# Patient Record
Sex: Male | Born: 1940 | Race: White | Hispanic: No | Marital: Married
Health system: Southern US, Community
[De-identification: ages and names within clinical notes are randomized; demographics above are authoritative.]

## PROBLEM LIST (undated history)

## (undated) ENCOUNTER — Emergency Department (HOSPITAL_COMMUNITY): Disposition: A | Discharge status: Other Acute Inpatient Hospital | Payer: Medicare PPO

## (undated) DIAGNOSIS — I2510 Atherosclerotic heart disease of native coronary artery without angina pectoris: Secondary | ICD-10-CM

## (undated) DIAGNOSIS — G459 Transient cerebral ischemic attack, unspecified: Secondary | ICD-10-CM

## (undated) DIAGNOSIS — E039 Hypothyroidism, unspecified: Secondary | ICD-10-CM

## (undated) DIAGNOSIS — E669 Obesity, unspecified: Secondary | ICD-10-CM

## (undated) DIAGNOSIS — E785 Hyperlipidemia, unspecified: Secondary | ICD-10-CM

## (undated) HISTORY — PX: TRACHEOSTOMY: SUR1362

## (undated) HISTORY — PX: CORONARY ARTERY BYPASS GRAFT: SHX141

## (undated) HISTORY — DX: Hyperlipidemia, unspecified: E78.5

## (undated) HISTORY — DX: Obesity, unspecified: E66.9

## (undated) HISTORY — DX: Hypothyroidism, unspecified: E03.9

## (undated) HISTORY — PX: AMPUTATION: SHX166

## (undated) HISTORY — DX: Transient cerebral ischemic attack, unspecified: G45.9

## (undated) HISTORY — DX: Atherosclerotic heart disease of native coronary artery without angina pectoris: I25.10

---

## 2011-08-23 ENCOUNTER — Encounter: Payer: Self-pay | Admitting: Cardiology

## 2011-08-23 ENCOUNTER — Ambulatory Visit (INDEPENDENT_AMBULATORY_CARE_PROVIDER_SITE_OTHER): Payer: Medicare PPO | Admitting: Cardiology

## 2011-08-23 VITALS — BP 110/60 | Ht 72.0 in | Wt 282.0 lb

## 2011-08-23 DIAGNOSIS — E119 Type 2 diabetes mellitus without complications: Secondary | ICD-10-CM

## 2011-08-23 DIAGNOSIS — I2510 Atherosclerotic heart disease of native coronary artery without angina pectoris: Secondary | ICD-10-CM | POA: Insufficient documentation

## 2011-08-23 DIAGNOSIS — E1140 Type 2 diabetes mellitus with diabetic neuropathy, unspecified: Secondary | ICD-10-CM | POA: Insufficient documentation

## 2011-08-23 DIAGNOSIS — E669 Obesity, unspecified: Secondary | ICD-10-CM

## 2011-08-23 DIAGNOSIS — E785 Hyperlipidemia, unspecified: Secondary | ICD-10-CM

## 2011-08-23 DIAGNOSIS — I10 Essential (primary) hypertension: Secondary | ICD-10-CM

## 2011-08-23 NOTE — Assessment & Plan Note (Signed)
His LDL was 115 when last checked with a goal less than 70.  I will defer to Redmond Baseman, MD

## 2011-08-23 NOTE — Patient Instructions (Signed)
The current medical regimen is effective;  continue present plan and medications.  Follow up in 6 months with Dr Hochrein.  You will receive a letter in the mail 2 months before you are due.  Please call us when you receive this letter to schedule your follow up appointment.  

## 2011-08-23 NOTE — Assessment & Plan Note (Signed)
The blood pressure is at target. No change in medications is indicated. We will continue with therapeutic lifestyle changes (TLC).  

## 2011-08-23 NOTE — Progress Notes (Signed)
HPI The patient presents for followup of known coronary disease. He had bypass at [facility].  He did have a stress perfusion study and I was able to see these results from 2010. He had a mildly reduced ejection fraction with an old inferior infarct. At that time he was having some arm pain. However, since that time he has had no new symptoms.  The patient denies any new symptoms such as chest discomfort, neck or arm discomfort. There has been no new shortness of breath, PND or orthopnea. There have been no reported palpitations, presyncope or syncope.  He is not particularly active however.  He is has relocated to this area. To live close to his daughter.  No Known Allergies  Current Outpatient Prescriptions  Medication Sig Dispense Refill  . aspirin 81 MG tablet Take 81 mg by mouth daily.      . clopidogrel (PLAVIX) 75 MG tablet Take 75 mg by mouth daily.      . fosinopril (MONOPRIL) 20 MG tablet Take 20 mg by mouth daily.      . Insulin Lispro, Human, (HUMALOG PEN ) Inject into the skin. 60 units in am and 40 unit in pm      . levothyroxine (SYNTHROID, LEVOTHROID) 25 MCG tablet Take 25 mcg by mouth daily.      . metFORMIN (GLUCOPHAGE) 500 MG tablet Take 500 mg by mouth 2 (two) times daily with a meal.      . nitroGLYCERIN (NITROSTAT) 0.4 MG SL tablet Place 0.4 mg under the tongue every 5 (five) minutes as needed.      . pravastatin (PRAVACHOL) 40 MG tablet Take 40 mg by mouth daily.        Past Medical History  Diagnosis Date  . CAD (coronary artery disease)   . HTN (hypertension), benign   . Diabetes mellitus   . Hyperlipemia   . Hypothyroidism     Past Surgical History  Procedure Date  . Coronary artery bypass graft     2 vessel 2000, [facility]  . Tracheostomy     Age 12  . Amputation     Right second toe/nonhealing ulcer    Family History  Problem Relation Age of Onset  . Cancer Father     Lung  . Cancer Mother     Breast  . Cancer Brother     Lung     History   Social History  . Marital Status: Widowed    Spouse Name: N/A    Number of Children: N/A  . Years of Education: N/A   Occupational History  . Retired     Pensions consultant for [employer]   Social History Main Topics  . Smoking status: Former Smoker    Quit date: 08/23/1991  . Smokeless tobacco: Not on file  . Alcohol Use: Not on file  . Drug Use: Not on file  . Sexually Active: Not on file   Other Topics Concern  . Not on file   Social History Narrative   Lives with daughter.  He has two children and four grandchildren.      ROS:  Absent peripheral vision, GERD, hip and knee pain. As stated in the HPI and negative for all other systems.  PHYSICAL EXAM BP 110/60  Ht 6' (1.829 m)  Wt 282 lb (127.914 kg)  BMI 38.25 kg/m2 GENERAL:  Well appearing HEENT:  Pupils equal round and reactive, fundi not visualized, oral mucosa unremarkable, dentures. NECK:  No jugular  venous distention, waveform within normal limits, carotid upstroke brisk and symmetric, no bruits, no thyromegaly LYMPHATICS:  No cervical, inguinal adenopathy LUNGS:  Clear to auscultation bilaterally BACK:  No CVA tenderness CHEST:  Well healed sternotomy scar. HEART:  PMI not displaced or sustained,S1 and S2 within normal limits, no S3, no S4, no clicks, no rubs, no murmurs ABD:  Flat, positive bowel sounds normal in frequency in pitch, no bruits, no rebound, no guarding, no midline pulsatile mass, no hepatomegaly, no splenomegaly, obese EXT:  2 plus pulses throughout, bilateral edema, no cyanosis no clubbing SKIN:  No rashes no nodules NEURO:  Cranial nerves II through XII grossly intact, motor grossly intact throughout PSYCH:  Cognitively intact, oriented to person place and time  EKG:   Sinus rhythm, rate 60, conduction delay, old inferior infarct, left for the by voltage criteria, lateral T-wave inversions. No old EKGs for comparison. 08/23/2011   ASSESSMENT AND PLAN

## 2011-08-23 NOTE — Assessment & Plan Note (Signed)
The patient has no new sypmtoms.  No further cardiovascular testing is indicated.  We will continue with aggressive risk reduction and meds as listed.  I did give him instruction in increasing his activity.  I will likely schedule a screening stress test at the next visit given his 71 year old grafts and lifestyle.

## 2011-08-23 NOTE — Assessment & Plan Note (Signed)
His hemoglobin A1c was greater than 11. This is being dressed by Redmond Baseman, MD.

## 2011-08-23 NOTE — Assessment & Plan Note (Signed)
The patient understands the need to lose weight with diet and exercise. We have discussed specific strategies for this.  

## 2011-09-06 ENCOUNTER — Institutional Professional Consult (permissible substitution): Payer: Self-pay | Admitting: Cardiology

## 2014-04-29 HISTORY — PX: CIRCUMCISION: SHX1350

## 2014-06-18 ENCOUNTER — Ambulatory Visit (INDEPENDENT_AMBULATORY_CARE_PROVIDER_SITE_OTHER): Payer: Medicare HMO | Admitting: Family Medicine

## 2014-06-18 ENCOUNTER — Encounter: Payer: Self-pay | Admitting: Family Medicine

## 2014-06-18 VITALS — BP 124/68 | HR 73 | Temp 98.0°F | Ht 72.0 in | Wt 259.4 lb

## 2014-06-18 DIAGNOSIS — R5383 Other fatigue: Secondary | ICD-10-CM

## 2014-06-18 DIAGNOSIS — E11649 Type 2 diabetes mellitus with hypoglycemia without coma: Secondary | ICD-10-CM | POA: Diagnosis not present

## 2014-06-18 DIAGNOSIS — I10 Essential (primary) hypertension: Secondary | ICD-10-CM | POA: Diagnosis not present

## 2014-06-18 DIAGNOSIS — N400 Enlarged prostate without lower urinary tract symptoms: Secondary | ICD-10-CM | POA: Diagnosis not present

## 2014-06-18 DIAGNOSIS — E785 Hyperlipidemia, unspecified: Secondary | ICD-10-CM

## 2014-06-18 DIAGNOSIS — E1165 Type 2 diabetes mellitus with hyperglycemia: Secondary | ICD-10-CM | POA: Diagnosis not present

## 2014-06-18 DIAGNOSIS — E119 Type 2 diabetes mellitus without complications: Secondary | ICD-10-CM

## 2014-06-18 DIAGNOSIS — E038 Other specified hypothyroidism: Secondary | ICD-10-CM | POA: Diagnosis not present

## 2014-06-18 LAB — POCT CBC
Granulocyte percent: 67.9 %G (ref 37–80)
HCT, POC: 41.9 % — AB (ref 43.5–53.7)
Hemoglobin: 13.1 g/dL — AB (ref 14.1–18.1)
Lymph, poc: 1 (ref 0.6–3.4)
MCH: 27.8 pg (ref 27–31.2)
MCHC: 31.2 g/dL — AB (ref 31.8–35.4)
MCV: 89.1 fL (ref 80–97)
MPV: 8.4 fL (ref 0–99.8)
PLATELET COUNT, POC: 227 10*3/uL (ref 142–424)
POC GRANULOCYTE: 2.9 (ref 2–6.9)
POC LYMPH PERCENT: 25 %L (ref 10–50)
RBC: 4.7 M/uL (ref 4.69–6.13)
RDW, POC: 13.4 %
WBC: 4.2 10*3/uL — AB (ref 4.6–10.2)

## 2014-06-18 LAB — GLUCOSE, POCT (MANUAL RESULT ENTRY): POC Glucose: 467 mg/dl — AB (ref 70–99)

## 2014-06-18 LAB — POCT GLYCOSYLATED HEMOGLOBIN (HGB A1C): Hemoglobin A1C: 12.8

## 2014-06-18 MED ORDER — DULAGLUTIDE 0.75 MG/0.5ML ~~LOC~~ SOAJ: 1.0000 | SUBCUTANEOUS | Status: DC

## 2014-06-18 MED ORDER — TAMSULOSIN HCL 0.4 MG PO CAPS: 0.8000 mg | ORAL_CAPSULE | Freq: Every day | ORAL | Status: DC

## 2014-06-18 MED ORDER — DULAGLUTIDE 0.75 MG/0.5ML ~~LOC~~ SOAJ: 0.5000 mL | SUBCUTANEOUS | Status: DC

## 2014-06-18 NOTE — Progress Notes (Addendum)
Subjective:    Patient ID: William Sharp, male    DOB: 11/07/1940, 75 y.o.   MRN: [id number]  HPI  Patient is here today for a follow up of chronic medical problems which include diabetes, hyperlipidemia and CAD.  Patient check blood sugar at home daily fasting between 100 and 140. The evening before supper it runs between 140 and 160. So hemoglobin A1c 5 months ago was 10.6. Prior to that it was up to 13.6. Patient denies symptoms such as polyuria, polydipsia, excessive hunger, nausea No significant hypoglycemic spells noted. When sugar drops below 100 makes him nervous nose goes numb. The few mouthfuls of peanut butter Medications as noted below. Taking them regularly without complication/adverse reaction being reported today.    Patient in for follow-up of hypertension. Patient has no history of headache chest pain or shortness of breath or recent cough. Patient also denies symptoms of TIA such as numbness weakness lateralizing. Patient checks  blood pressure at home and has not had any elevated readings recently. Patient denies side effects from his medication. States taking it regularly.   Pt. Having trouble urinating since recent circumcision. Has to sit and strain.Nocturia X 1 per night, but 4 X last night because he drank a bottle of water at bedtime.     No Known Allergies  Outpatient Encounter Prescriptions as of 06/18/2014  Medication Sig  . aspirin 81 MG tablet Take 81 mg by mouth daily.  . clopidogrel (PLAVIX) 75 MG tablet Take 75 mg by mouth daily.  . fosinopril (MONOPRIL) 20 MG tablet Take 20 mg by mouth daily.  Marland Kitchen gabapentin (NEURONTIN) 300 MG capsule Take 300 mg by mouth at bedtime.  . Insulin Lispro, Human, (HUMALOG PEN [place]) Inject into the skin. 60 units in am and 40 unit in pm  . levothyroxine (SYNTHROID, LEVOTHROID) 25 MCG tablet Take 25 mcg by mouth daily.  . metFORMIN (GLUCOPHAGE) 500 MG tablet Take 500 mg by mouth 2 (two) times daily with a meal.  . nitroGLYCERIN  (NITROSTAT) 0.4 MG SL tablet Place 0.4 mg under the tongue every 5 (five) minutes as needed.  . pravastatin (PRAVACHOL) 40 MG tablet Take 40 mg by mouth daily.    Past Medical History  Diagnosis Date  . CAD (coronary artery disease)   . HTN (hypertension), benign   . Diabetes mellitus   . Hyperlipemia   . Hypothyroidism   . Obesity     Past Surgical History  Procedure Laterality Date  . Coronary artery bypass graft      2 vessel 2000, [facility]  . Tracheostomy      Age 19  . Amputation      Right second toe/nonhealing ulcer    History   Social History  . Marital Status: Widowed    Spouse Name: N/A    Number of Children: N/A  . Years of Education: N/A   Occupational History  . Retired     Merchant navy officer for [employer] History Main Topics  . Smoking status: Former Smoker    Quit date: 08/23/1991  . Smokeless tobacco: Not on file  . Alcohol Use: Not on file  . Drug Use: Not on file  . Sexual Activity: Not on file   Other Topics Concern  . Not on file   Social History Narrative   Lives with daughter.  He has two children and four grandchildren.        Review of Systems  Constitutional: Negative for fever, chills,  diaphoresis and unexpected weight change.  HENT: Negative for congestion, hearing loss, rhinorrhea, sore throat and trouble swallowing.   Respiratory: Negative for cough, chest tightness, shortness of breath and wheezing.   Cardiovascular: Positive for leg swelling.  Gastrointestinal: Negative for nausea, vomiting, abdominal pain, diarrhea, constipation and abdominal distention.  Endocrine: Negative for cold intolerance and heat intolerance.  Genitourinary: Positive for frequency. Negative for dysuria, hematuria, flank pain, penile swelling, scrotal swelling and penile pain.  Musculoskeletal: Negative for joint swelling and arthralgias.  Skin: Negative for rash.  Neurological: Negative for dizziness and headaches.    Psychiatric/Behavioral: Negative for dysphoric mood, decreased concentration and agitation. The patient is not nervous/anxious.        Objective:   Physical Exam  Constitutional: He is oriented to person, place, and time. He appears well-developed and well-nourished. No distress.  Obese  HENT:  Head: Normocephalic and atraumatic.  Right Ear: External ear normal.  Left Ear: External ear normal.  Nose: Nose normal.  Mouth/Throat: Oropharynx is clear and moist.  Eyes: Conjunctivae and EOM are normal. Pupils are equal, round, and reactive to light.  Neck: Normal range of motion. Neck supple. No thyromegaly present.  Cardiovascular: Normal rate, regular rhythm and normal heart sounds.   No murmur heard. Pulmonary/Chest: Effort normal and breath sounds normal. No respiratory distress. He has no wheezes. He has no rales.  Abdominal: Soft. Bowel sounds are normal. He exhibits no distension. There is no tenderness.  Lymphadenopathy:    He has no cervical adenopathy.  Neurological: He is alert and oriented to person, place, and time. He has normal reflexes.  Skin: Skin is warm and dry.  Psychiatric: He has a normal mood and affect. His behavior is normal. Judgment and thought content normal.   BP 124/68 mmHg  Pulse 73  Temp(Src) 98 F (36.7 C) (Oral)  Ht 6' (1.829 m)  Wt 259 lb 6.4 oz (117.663 kg)  BMI 35.17 kg/m2        Assessment & Plan:   1. Uncontrolled diabetes mellitus with hypoglycemia   2. Other specified hypothyroidism   3. Hyperlipemia   4. Other fatigue   5. Essential hypertension   6. BPH (benign prostatic hyperplasia)     Meds ordered this encounter  Medications  . gabapentin (NEURONTIN) 300 MG capsule    Sig: Take 300 mg by mouth at bedtime.  . tamsulosin (FLOMAX) 0.4 MG CAPS capsule    Sig: Take 2 capsules (0.8 mg total) by mouth daily after supper.    Dispense:  60 capsule    Refill:  3  . Dulaglutide (TRULICITY) 8.41 LK/4.4WN SOPN    Sig: Inject 1  Dose into the skin once a week.    Dispense:  4 pen    Refill:  5  . DISCONTD: Dulaglutide (TRULICITY) 0.27 OZ/3.6UY SOPN    Sig: Inject 0.5 mLs into the skin once a week.    Dispense:  2 pen    Refill:  0    Orders Placed This Encounter  Procedures  . Lipid panel  . CMP14+EGFR  . Thyroid Panel With TSH  . POCT CBC  . POCT glycosylated hemoglobin (Hb A1C)  . POCT glucose (manual entry)    Labs pending Health Maintenance reviewed Diet and exercise encouraged Continue all meds as discussed Follow up in 3 mos  Claretta Fraise, MD

## 2014-06-19 LAB — CMP14+EGFR
ALT: 6 IU/L (ref 0–44)
AST: 9 IU/L (ref 0–40)
Albumin/Globulin Ratio: 1.8 (ref 1.1–2.5)
Albumin: 4.1 g/dL (ref 3.5–4.8)
Alkaline Phosphatase: 69 IU/L (ref 39–117)
BUN/Creatinine Ratio: 19 (ref 10–22)
BUN: 24 mg/dL (ref 8–27)
CO2: 24 mmol/L (ref 18–29)
Calcium: 9.1 mg/dL (ref 8.6–10.2)
Chloride: 92 mmol/L — ABNORMAL LOW (ref 97–108)
Creatinine, Ser: 1.27 mg/dL (ref 0.76–1.27)
GFR calc Af Amer: 63 mL/min/{1.73_m2} (ref >59)
GFR, EST NON AFRICAN AMERICAN: 55 mL/min/{1.73_m2} — AB (ref >59)
GLOBULIN, TOTAL: 2.3 g/dL (ref 1.5–4.5)
Glucose: 563 mg/dL (ref 65–99)
POTASSIUM: 4.8 mmol/L (ref 3.5–5.2)
SODIUM: 131 mmol/L — AB (ref 134–144)
Total Bilirubin: 0.5 mg/dL (ref 0.0–1.2)
Total Protein: 6.4 g/dL (ref 6.0–8.5)

## 2014-06-19 LAB — LIPID PANEL
CHOLESTEROL TOTAL: 172 mg/dL (ref 100–199)
Chol/HDL Ratio: 5.4 ratio units — ABNORMAL HIGH (ref 0.0–5.0)
HDL: 32 mg/dL — ABNORMAL LOW (ref >39)
LDL CALC: 115 mg/dL — AB (ref 0–99)
Triglycerides: 124 mg/dL (ref 0–149)
VLDL CHOLESTEROL CAL: 25 mg/dL (ref 5–40)

## 2014-06-19 LAB — THYROID PANEL WITH TSH
Free Thyroxine Index: 1.1 — ABNORMAL LOW (ref 1.2–4.9)
T3 UPTAKE RATIO: 29 % (ref 24–39)
T4 TOTAL: 3.7 ug/dL — AB (ref 4.5–12.0)
TSH: 1.4 u[IU]/mL (ref 0.450–4.500)

## 2014-06-22 ENCOUNTER — Other Ambulatory Visit: Payer: Self-pay | Admitting: *Deleted

## 2014-06-22 MED ORDER — LEVOTHYROXINE SODIUM 50 MCG PO TABS: 50.0000 ug | ORAL_TABLET | Freq: Every day | ORAL | Status: DC

## 2014-06-22 NOTE — Progress Notes (Signed)
Per Dr Darlyn ReadStacks note Increased levothyroxine

## 2014-06-24 ENCOUNTER — Telehealth: Payer: Self-pay | Admitting: Family Medicine

## 2014-06-24 NOTE — Telephone Encounter (Signed)
Called in.

## 2014-07-17 ENCOUNTER — Encounter: Payer: Self-pay | Admitting: Family Medicine

## 2014-07-17 ENCOUNTER — Ambulatory Visit (INDEPENDENT_AMBULATORY_CARE_PROVIDER_SITE_OTHER): Payer: Medicare HMO | Admitting: Family Medicine

## 2014-07-17 VITALS — BP 110/57 | HR 68 | Temp 97.2°F | Ht 72.0 in | Wt 263.2 lb

## 2014-07-17 DIAGNOSIS — E669 Obesity, unspecified: Secondary | ICD-10-CM | POA: Diagnosis not present

## 2014-07-17 DIAGNOSIS — E1165 Type 2 diabetes mellitus with hyperglycemia: Secondary | ICD-10-CM | POA: Diagnosis not present

## 2014-07-17 DIAGNOSIS — IMO0002 Reserved for concepts with insufficient information to code with codable children: Secondary | ICD-10-CM

## 2014-07-17 DIAGNOSIS — I10 Essential (primary) hypertension: Secondary | ICD-10-CM

## 2014-07-17 NOTE — Progress Notes (Signed)
Subjective:  Patient ID: William Sharp, male    DOB: [birth year]  Age: 74 y.o. MRN: [id number]  CC: Diabetes   HPI Salam Micucci presents for recheck of his diabetes due to elevated blood sugars and hypoglycemic impact. Patient does  check blood sugar at home. Log sheets reviewed and are now attached. He took to elicit the samples as ordered. However the expense of its can be several $100 per month and he cannot afford that. He states it worked quite well he tolerated it without significant nausea and his sugars were much better while taking it. Patient denies symptoms such as polyuria, polydipsia, excessive hunger, nausea No significant hypoglycemic spells noted since last month based on the changes to his insulin and addition of [place]. Medications as noted below. Taking them regularly without complication/adverse reaction being reported today.  History Quang has a past medical history of CAD (coronary artery disease); HTN (hypertension), benign; Diabetes mellitus; Hyperlipemia; Hypothyroidism; Obesity; and TIA (transient ischemic attack).   He has past surgical history that includes Coronary artery bypass graft; Tracheostomy; Amputation; and Circumcision (04/29/2014).   His family history includes Cancer in his brother, father, and mother.He reports that he quit smoking about 22 years ago. He does not have any smokeless tobacco history on file. His alcohol and drug histories are not on file.  Current Outpatient Prescriptions on File Prior to Visit  Medication Sig Dispense Refill  . aspirin 81 MG tablet Take 81 mg by mouth daily.    . clopidogrel (PLAVIX) 75 MG tablet Take 75 mg by mouth daily.    . Dulaglutide (TRULICITY) 0.75 MG/0.5ML SOPN Inject 1 Dose into the skin once a week. 4 pen 5  . fosinopril (MONOPRIL) 20 MG tablet Take 20 mg by mouth daily.    Marland Kitchen gabapentin (NEURONTIN) 300 MG capsule Take 300 mg by mouth at bedtime.    . Insulin Lispro, Human, (HUMALOG PEN [place]) Inject into the  skin. 60 units in am and 40 unit in pm    . levothyroxine (SYNTHROID, LEVOTHROID) 50 MCG tablet Take 1 tablet (50 mcg total) by mouth daily. 30 tablet 1  . metFORMIN (GLUCOPHAGE) 500 MG tablet Take 500 mg by mouth 2 (two) times daily with a meal.    . nitroGLYCERIN (NITROSTAT) 0.4 MG SL tablet Place 0.4 mg under the tongue every 5 (five) minutes as needed.    . pravastatin (PRAVACHOL) 40 MG tablet Take 40 mg by mouth daily.    . tamsulosin (FLOMAX) 0.4 MG CAPS capsule Take 2 capsules (0.8 mg total) by mouth daily after supper. 60 capsule 3   No current facility-administered medications on file prior to visit.    ROS Review of Systems  Constitutional: Negative for fever, chills, diaphoresis and unexpected weight change.  HENT: Negative for congestion, hearing loss, rhinorrhea, sore throat and trouble swallowing.   Respiratory: Negative for cough, chest tightness, shortness of breath and wheezing.   Gastrointestinal: Negative for nausea, vomiting, abdominal pain, diarrhea, constipation and abdominal distention.  Endocrine: Negative for cold intolerance and heat intolerance.  Genitourinary: Negative for dysuria, hematuria and flank pain.  Musculoskeletal: Negative for joint swelling and arthralgias.  Skin: Negative for rash.  Neurological: Negative for dizziness and headaches.  Psychiatric/Behavioral: Negative for dysphoric mood, decreased concentration and agitation. The patient is not nervous/anxious.     Objective:  BP 110/57 mmHg  Pulse 68  Temp(Src) 97.2 F (36.2 C) (Oral)  Ht 6' (1.829 m)  Wt 263 lb 3.2 oz (119.387 kg)  BMI 35.69 kg/m2  BP Readings from Last 3 Encounters:  07/17/14 110/57  06/18/14 124/68  08/23/11 110/60    Wt Readings from Last 3 Encounters:  07/17/14 263 lb 3.2 oz (119.387 kg)  06/18/14 259 lb 6.4 oz (117.663 kg)  08/23/11 282 lb (127.914 kg)     Physical Exam  Constitutional: He is oriented to person, place, and time. He appears well-developed  and well-nourished. No distress.  HENT:  Head: Normocephalic and atraumatic.  Right Ear: External ear normal.  Left Ear: External ear normal.  Nose: Nose normal.  Mouth/Throat: Oropharynx is clear and moist.  Eyes: Conjunctivae and EOM are normal. Pupils are equal, round, and reactive to light.  Neck: Normal range of motion. Neck supple. No thyromegaly present.  Cardiovascular: Normal rate, regular rhythm and normal heart sounds.   No murmur heard. Pulmonary/Chest: Effort normal and breath sounds normal. No respiratory distress. He has no wheezes. He has no rales.  Abdominal: Soft. Bowel sounds are normal. He exhibits no distension. There is no tenderness.  Lymphadenopathy:    He has no cervical adenopathy.  Neurological: He is alert and oriented to person, place, and time. He has normal reflexes.  Skin: Skin is warm and dry.  Psychiatric: He has a normal mood and affect. His behavior is normal. Judgment and thought content normal.    Lab Results  Component Value Date   HGBA1C 12.8% 06/18/2014    Lab Results  Component Value Date   WBC 4.2* 06/18/2014   HGB 13.1* 06/18/2014   HCT 41.9* 06/18/2014   GLUCOSE 563* 06/18/2014   CHOL 172 06/18/2014   TRIG 124 06/18/2014   HDL 32* 06/18/2014   LDLCALC 115* 06/18/2014   ALT 6 06/18/2014   AST 9 06/18/2014   NA 131* 06/18/2014   K 4.8 06/18/2014   CL 92* 06/18/2014   CREATININE 1.27 06/18/2014   BUN 24 06/18/2014   CO2 24 06/18/2014   TSH 1.400 06/18/2014   HGBA1C 12.8% 06/18/2014    Patient was never admitted.  Assessment & Plan:   Jonny RuizJohn was seen today for diabetes.  Diagnoses and all orders for this visit:  Uncontrolled diabetes mellitus  HTN (hypertension), benign  Obesity  I am having Mr. Burt KnackCaines maintain his clopidogrel, metFORMIN, (Insulin Lispro, Human, (HUMALOG PEN [place])), aspirin, nitroGLYCERIN, pravastatin, fosinopril, gabapentin, tamsulosin, Dulaglutide, levothyroxine, and NOVOLOG.  Meds ordered this  encounter  Medications  . NOVOLOG 100 UNIT/ML injection    Sig: 60u q AM, 50u hs   He will discontinue the Trulicity  Follow-up: Return in about 1 month (around 08/17/2014) for diabetes.  Mechele ClaudeWarren Ambrose Wile, M.D.

## 2014-07-17 NOTE — Patient Instructions (Signed)
dISCONTINUE Trulicity.  For low blood sugar buy glucose tablets.

## 2014-07-23 ENCOUNTER — Telehealth: Payer: Self-pay | Admitting: Family Medicine

## 2014-07-23 MED ORDER — DULAGLUTIDE 0.75 MG/0.5ML ~~LOC~~ SOAJ: 1.0000 | SUBCUTANEOUS | Status: DC

## 2014-07-23 MED ORDER — FOSINOPRIL SODIUM 20 MG PO TABS: 20.0000 mg | ORAL_TABLET | Freq: Every day | ORAL | Status: DC

## 2014-07-23 MED ORDER — PRAVASTATIN SODIUM 40 MG PO TABS: 40.0000 mg | ORAL_TABLET | Freq: Every day | ORAL | Status: DC

## 2014-07-23 MED ORDER — CLOPIDOGREL BISULFATE 75 MG PO TABS: 75.0000 mg | ORAL_TABLET | Freq: Every day | ORAL | Status: DC

## 2014-07-23 NOTE — Telephone Encounter (Signed)
Patient aware rx's have been printed and left up front for patient.

## 2014-08-17 ENCOUNTER — Encounter: Payer: Self-pay | Admitting: Family Medicine

## 2014-08-17 ENCOUNTER — Ambulatory Visit (INDEPENDENT_AMBULATORY_CARE_PROVIDER_SITE_OTHER): Payer: Medicare HMO | Admitting: Family Medicine

## 2014-08-17 VITALS — BP 131/64 | HR 68 | Temp 96.6°F | Ht 72.0 in | Wt 256.0 lb

## 2014-08-17 DIAGNOSIS — IMO0002 Reserved for concepts with insufficient information to code with codable children: Secondary | ICD-10-CM

## 2014-08-17 DIAGNOSIS — E1165 Type 2 diabetes mellitus with hyperglycemia: Secondary | ICD-10-CM | POA: Diagnosis not present

## 2014-08-17 DIAGNOSIS — E11649 Type 2 diabetes mellitus with hypoglycemia without coma: Secondary | ICD-10-CM

## 2014-08-17 NOTE — Patient Instructions (Addendum)
Consider Greek yogurt for breakfast. 2 brands that I like are [employer]ikos and Chobani.  Always eat breakfast.   Check your blood sugar before breakfast and again two hours after one meal (of your choice) daily.  Decrease your insulin to 50 units in the morning and 40 in the evening. They should be before breakfast and before supper. This should decrease your risk of low sugar reactions.  Get some exercise every day.

## 2014-08-17 NOTE — Progress Notes (Signed)
Subjective:  Patient ID: William Sharp, male    DOB: 1940-06-21  Age: 74 y.o. MRN: [id number]  CC: Diabetes   HPI   patient is exercising regularly [facility] and he is walking near his home. He has had some low sugar blood spells low blood sugar spells. Just before lunch and in the evening after supper. He does continue to take the Trulicity the NovoLog and 3 other medications he's not sure what they are.   William Sharp presents for  Hypoglycemia at lunchtime and in the evening before bed.  Relief with orange juice. Symptoms of dizziness and confusion as well as nausea and weakness have occurred on several occasions. History Kimani has a past medical history of CAD (coronary artery disease); HTN (hypertension), benign; Diabetes mellitus; Hyperlipemia; Hypothyroidism; Obesity; and TIA (transient ischemic attack).   He has past surgical history that includes Coronary artery bypass graft; Tracheostomy; Amputation; and Circumcision (04/29/2014).   His family history includes Cancer in his brother, father, and mother.He reports that he quit smoking about 23 years ago. He does not have any smokeless tobacco history on file. His alcohol and drug histories are not on file.  Current Outpatient Prescriptions on File Prior to Visit  Medication Sig Dispense Refill  . aspirin 81 MG tablet Take 81 mg by mouth daily.    . clopidogrel (PLAVIX) 75 MG tablet Take 1 tablet (75 mg total) by mouth daily. 30 tablet 3  . Dulaglutide (TRULICITY) 0.75 MG/0.5ML SOPN Inject 1 Dose into the skin once a week. 4 pen 5  . fosinopril (MONOPRIL) 20 MG tablet Take 1 tablet (20 mg total) by mouth daily. 30 tablet 3  . gabapentin (NEURONTIN) 300 MG capsule Take 300 mg by mouth at bedtime.    . Insulin Lispro, Human, (HUMALOG PEN [place]) Inject into the skin. 60 units in am and 40 unit in pm    . levothyroxine (SYNTHROID, LEVOTHROID) 50 MCG tablet Take 1 tablet (50 mcg total) by mouth daily. 30 tablet 1  . metFORMIN  (GLUCOPHAGE) 500 MG tablet Take 500 mg by mouth 2 (two) times daily with a meal.    . nitroGLYCERIN (NITROSTAT) 0.4 MG SL tablet Place 0.4 mg under the tongue every 5 (five) minutes as needed.    Marland Kitchen. NOVOLOG 100 UNIT/ML injection 60u q AM, 50u hs    . pravastatin (PRAVACHOL) 40 MG tablet Take 1 tablet (40 mg total) by mouth daily. 30 tablet 3  . tamsulosin (FLOMAX) 0.4 MG CAPS capsule Take 2 capsules (0.8 mg total) by mouth daily after supper. 60 capsule 3   No current facility-administered medications on file prior to visit.    ROS Review of Systems  Constitutional: Negative for fever, chills, diaphoresis and unexpected weight change.  HENT: Negative for congestion, hearing loss, rhinorrhea, sore throat and trouble swallowing.   Respiratory: Negative for cough, chest tightness, shortness of breath and wheezing.   Gastrointestinal: Negative for nausea, vomiting, abdominal pain, diarrhea, constipation and abdominal distention.  Endocrine: Negative for cold intolerance and heat intolerance.  Genitourinary: Negative for dysuria, hematuria and flank pain.  Musculoskeletal: Negative for joint swelling and arthralgias.  Skin: Negative for rash.  Neurological: Negative for dizziness and headaches.  Psychiatric/Behavioral: Negative for dysphoric mood, decreased concentration and agitation. The patient is not nervous/anxious.     Objective:  BP 131/64 mmHg  Pulse 68  Temp(Src) 96.6 F (35.9 C) (Oral)  Ht 6' (1.829 m)  Wt 256 lb (116.121 kg)  BMI  34.71 kg/m2  BP Readings from Last 3 Encounters:  08/17/14 131/64  07/17/14 110/57  06/18/14 124/68    Wt Readings from Last 3 Encounters:  08/17/14 256 lb (116.121 kg)  07/17/14 263 lb 3.2 oz (119.387 kg)  06/18/14 259 lb 6.4 oz (117.663 kg)     Physical Exam  Constitutional: He is oriented to person, place, and time. He appears well-developed and well-nourished. No distress.  HENT:  Head: Normocephalic and atraumatic.  Right Ear:  External ear normal.  Left Ear: External ear normal.  Nose: Nose normal.  Mouth/Throat: Oropharynx is clear and moist.  Eyes: Conjunctivae and EOM are normal. Pupils are equal, round, and reactive to light.  Neck: Normal range of motion. Neck supple. No thyromegaly present.  Cardiovascular: Normal rate, regular rhythm and normal heart sounds.   No murmur heard. Pulmonary/Chest: Effort normal and breath sounds normal. No respiratory distress. He has no wheezes. He has no rales.  Abdominal: Soft. Bowel sounds are normal. He exhibits no distension. There is no tenderness.  Lymphadenopathy:    He has no cervical adenopathy.  Neurological: He is alert and oriented to person, place, and time. He has normal reflexes.  Skin: Skin is warm and dry.  Psychiatric: He has a normal mood and affect. His behavior is normal. Judgment and thought content normal.    Lab Results  Component Value Date   HGBA1C 12.8% 06/18/2014    Lab Results  Component Value Date   WBC 4.2* 06/18/2014   HGB 13.1* 06/18/2014   HCT 41.9* 06/18/2014   GLUCOSE 563* 06/18/2014   CHOL 172 06/18/2014   TRIG 124 06/18/2014   HDL 32* 06/18/2014   LDLCALC 115* 06/18/2014   ALT 6 06/18/2014   AST 9 06/18/2014   NA 131* 06/18/2014   K 4.8 06/18/2014   CL 92* 06/18/2014   CREATININE 1.27 06/18/2014   BUN 24 06/18/2014   CO2 24 06/18/2014   TSH 1.400 06/18/2014   HGBA1C 12.8% 06/18/2014    Patient was never admitted.  Assessment & Plan:   There are no diagnoses linked to this encounter. I am having Mr. Givler maintain his metFORMIN, (Insulin Lispro, Human, (HUMALOG PEN [place])), aspirin, nitroGLYCERIN, gabapentin, tamsulosin, levothyroxine, NOVOLOG, Dulaglutide, clopidogrel, fosinopril, and pravastatin.  No orders of the defined types were placed in this encounter.   Insulin reduced to 50 units every morning and 40 every afternoon  Follow-up: Return in about 1 month (around 09/16/2014) for diabetes.  Mechele Claude,  M.D.

## 2014-08-27 ENCOUNTER — Encounter: Payer: Self-pay | Admitting: Nurse Practitioner

## 2014-08-27 ENCOUNTER — Ambulatory Visit (INDEPENDENT_AMBULATORY_CARE_PROVIDER_SITE_OTHER): Payer: Medicare HMO | Admitting: Nurse Practitioner

## 2014-08-27 VITALS — BP 123/63 | HR 74 | Temp 97.6°F | Ht 72.0 in | Wt 259.0 lb

## 2014-08-27 DIAGNOSIS — L02811 Cutaneous abscess of head [any part, except face]: Secondary | ICD-10-CM

## 2014-08-27 MED ORDER — SULFAMETHOXAZOLE-TRIMETHOPRIM 800-160 MG PO TABS: 1.0000 | ORAL_TABLET | Freq: Two times a day (BID) | ORAL | Status: DC

## 2014-08-27 NOTE — Progress Notes (Addendum)
   Subjective:    Patient ID: William MastersJohn Sharp, male    DOB: [birth year], 74 y.o.   MRN: [id number]  HPI Patient in c/o sore on top of head- noticed 1 week ago. Has gotten bigger with drainage.    Review of Systems  Constitutional: Negative.   HENT: Negative.   Respiratory: Negative.   Cardiovascular: Negative.   Neurological: Negative.   Psychiatric/Behavioral: Negative.   All other systems reviewed and are negative.      Objective:   Physical Exam  Constitutional: He is oriented to person, place, and time. He appears well-developed and well-nourished.  Cardiovascular: Normal rate, regular rhythm and normal heart sounds.   Pulmonary/Chest: Effort normal and breath sounds normal.  Neurological: He is alert and oriented to person, place, and time.  Skin:  3cm raised indurated scalp lesion - mid upper area   BP 123/63 mmHg  Pulse 74  Temp(Src) 97.6 F (36.4 C) (Oral)  Ht 6' (1.829 m)  Wt 259 lb (117.482 kg)  BMI 35.12 kg/m2  Procedure- lidocaine 1% with epi 2cc  Local  Cleaned with betadine  #15 blade for incision- no drainage extracted  Cleaned with saline       Assessment & Plan:   1. Scalp abscess    Meds ordered this encounter  Medications  . sulfamethoxazole-trimethoprim (BACTRIM DS) 800-160 MG per tablet    Sig: Take 1 tablet by mouth 2 (two) times daily.    Dispense:  14 tablet    Refill:  0    Order Specific Question:  Supervising Provider    Answer:  Deborra MedinaMOORE, DONALD W [[id number]]   Do not pick or scratch at area Cleaned with anitbacterial soap BID Warm compresses RTO if not improving  William Daphine DeutscherMartin, FNP

## 2014-08-27 NOTE — Patient Instructions (Addendum)
Incision and Drainage Care After Refer to this sheet in the next few weeks. These instructions provide you with information on caring for yourself after your procedure. Your caregiver may also give you more specific instructions. Your treatment has been planned according to current medical practices, but problems sometimes occur. Call your caregiver if you have any problems or questions after your procedure. HOME CARE INSTRUCTIONS   If antibiotic medicine is given, take it as directed. Finish it even if you start to feel better.  Only take over-the-counter or prescription medicines for pain, discomfort, or fever as directed by your caregiver.  Keep all follow-up appointments as directed by your caregiver.  Change any bandages (dressings) as directed by your caregiver. Replace old dressings with clean dressings.  Wash your hands before and after caring for your wound. You will receive specific instructions for cleansing and caring for your wound.  SEEK MEDICAL CARE IF:   You have increased pain, swelling, or redness around the wound.  You have increased drainage, smell, or bleeding from the wound.  You have muscle aches, chills, or you feel generally sick.  You have a fever. MAKE SURE YOU:   Understand these instructions.  Will watch your condition.  Will get help right away if you are not doing well or get worse. Document Released: 07/24/2011 Document Reviewed: 07/24/2011 Exit[employer] Patient Information 2015 [employer]. This information is not intended to replace advice given to you by your health care provider. Make sure you discuss any questions you have with your health care provider.  

## 2014-09-03 ENCOUNTER — Ambulatory Visit (INDEPENDENT_AMBULATORY_CARE_PROVIDER_SITE_OTHER): Payer: Medicare HMO | Admitting: Family Medicine

## 2014-09-03 ENCOUNTER — Encounter: Payer: Self-pay | Admitting: Family Medicine

## 2014-09-03 VITALS — BP 138/59 | HR 65 | Temp 97.0°F | Ht 72.0 in | Wt 255.4 lb

## 2014-09-03 DIAGNOSIS — L02811 Cutaneous abscess of head [any part, except face]: Secondary | ICD-10-CM

## 2014-09-03 MED ORDER — CIPROFLOXACIN HCL 500 MG PO TABS: 500.0000 mg | ORAL_TABLET | Freq: Two times a day (BID) | ORAL | Status: DC

## 2014-09-03 NOTE — Progress Notes (Signed)
Subjective:  Patient ID: William Sharp, male    DOB: 1940/11/07  Age: 74 y.o. MRN: [id number]  CC: Cyst   HPI William Sharp presents for failure of the draining cyst on his posterior scalp to resolve. He has been taking antibiotics for about 4 days. It was opened up here several days ago. He says it was very painful and has been draining since then. He's had no fever chills or sweats.   History William Sharp has a past medical history of CAD (coronary artery disease); HTN (hypertension), benign; Diabetes mellitus; Hyperlipemia; Hypothyroidism; Obesity; and TIA (transient ischemic attack).   He has past surgical history that includes Coronary artery bypass graft; Tracheostomy; Amputation; and Circumcision (04/29/2014).   His family history includes Cancer in his brother, father, and mother.He reports that he quit smoking about 23 years ago. He does not have any smokeless tobacco history on file. His alcohol and drug histories are not on file.  Current Outpatient Prescriptions on File Prior to Visit  Medication Sig Dispense Refill  . aspirin 81 MG tablet Take 81 mg by mouth daily.    . clopidogrel (PLAVIX) 75 MG tablet Take 1 tablet (75 mg total) by mouth daily. 30 tablet 3  . Dulaglutide (TRULICITY) 0.75 MG/0.5ML SOPN Inject 1 Dose into the skin once a week. 4 pen 5  . fosinopril (MONOPRIL) 20 MG tablet Take 1 tablet (20 mg total) by mouth daily. 30 tablet 3  . gabapentin (NEURONTIN) 300 MG capsule Take 300 mg by mouth at bedtime.    Marland Kitchen levothyroxine (SYNTHROID, LEVOTHROID) 50 MCG tablet Take 1 tablet (50 mcg total) by mouth daily. 30 tablet 1  . metFORMIN (GLUCOPHAGE) 500 MG tablet Take 500 mg by mouth 2 (two) times daily with a meal.    . nitroGLYCERIN (NITROSTAT) 0.4 MG SL tablet Place 0.4 mg under the tongue every 5 (five) minutes as needed.    Marland Kitchen NOVOLOG 100 UNIT/ML injection 50u q AM, 40u hs    . pravastatin (PRAVACHOL) 40 MG tablet Take 1 tablet (40 mg total) by mouth daily. 30 tablet 3  .  tamsulosin (FLOMAX) 0.4 MG CAPS capsule Take 2 capsules (0.8 mg total) by mouth daily after supper. 60 capsule 3   No current facility-administered medications on file prior to visit.    ROS Review of Systems  Constitutional: Negative for fever, chills, diaphoresis and unexpected weight change.  HENT: Negative for congestion, hearing loss, rhinorrhea, sore throat and trouble swallowing.   Respiratory: Negative for cough, chest tightness, shortness of breath and wheezing.   Gastrointestinal: Negative for nausea, vomiting, abdominal pain, diarrhea, constipation and abdominal distention.  Endocrine: Negative for cold intolerance and heat intolerance.  Genitourinary: Negative for dysuria, hematuria and flank pain.  Musculoskeletal: Negative for joint swelling and arthralgias.  Skin: Negative for rash.  Neurological: Negative for dizziness and headaches.  Psychiatric/Behavioral: Negative for dysphoric mood, decreased concentration and agitation. The patient is not nervous/anxious.     Objective:  BP 138/59 mmHg  Pulse 65  Temp(Src) 97 F (36.1 C) (Oral)  Ht 6' (1.829 m)  Wt 255 lb 6.4 oz (115.849 kg)  BMI 34.63 kg/m2  BP Readings from Last 3 Encounters:  09/03/14 138/59  08/27/14 123/63  08/17/14 131/64    Wt Readings from Last 3 Encounters:  09/03/14 255 lb 6.4 oz (115.849 kg)  08/27/14 259 lb (117.482 kg)  08/17/14 256 lb (116.121 kg)     Physical Exam  Constitutional: He appears well-developed and well-nourished.  HENT:  Head: Normocephalic and atraumatic.  Right Ear: Tympanic membrane and external ear normal. No decreased hearing is noted.  Left Ear: Tympanic membrane and external ear normal. No decreased hearing is noted.  Mouth/Throat: No oropharyngeal exudate or posterior oropharyngeal erythema.  Eyes: Pupils are equal, round, and reactive to light.  Neck: Normal range of motion. Neck supple.  Cardiovascular: Normal rate and regular rhythm.   No murmur  heard. Pulmonary/Chest: Breath sounds normal. No respiratory distress.  Abdominal: Soft. Bowel sounds are normal. He exhibits no mass. There is no tenderness.  Skin:  The occipital scalp near the midline at the most superior portion has a draining purulent cystic mass measuring 1.2 cm. This is surrounded by a raw tissue and is open into the subcutaneous tissue.  The patient was prepped sterilely draped appropriately. Local with 2% lidocaine with epi and 11 blade used to incise the lesion. Culturette was introduced for probing into the lesion. The cyst netted approximately 2 mL of mucopurulent drainage. The wound was cleansed and dressing applied. culture submitted for analysis  Vitals reviewed.   Lab Results  Component Value Date   HGBA1C 12.8 06/18/2014    Lab Results  Component Value Date   WBC 4.2* 06/18/2014   HGB 13.1* 06/18/2014   HCT 41.9* 06/18/2014   GLUCOSE 563* 06/18/2014   CHOL 172 06/18/2014   TRIG 124 06/18/2014   HDL 32* 06/18/2014   LDLCALC 115* 06/18/2014   ALT 6 06/18/2014   AST 9 06/18/2014   NA 131* 06/18/2014   K 4.8 06/18/2014   CL 92* 06/18/2014   CREATININE 1.27 06/18/2014   BUN 24 06/18/2014   CO2 24 06/18/2014   TSH 1.400 06/18/2014   HGBA1C 12.8 06/18/2014    Patient was never admitted.  Assessment & Plan:   William Sharp was seen today for cyst.  Diagnoses and all orders for this visit:  Scalp abscess Orders: -     Cancel: Ambulatory referral to Pediatric Plastic Surgery -     Aerobic culture -     Ambulatory referral to General Surgery  Other orders -     Discontinue: ciprofloxacin (CIPRO) 500 MG tablet; Take 1 tablet (500 mg total) by mouth 2 (two) times daily.   I have discontinued William Sharp's sulfamethoxazole-trimethoprim. I am also having him maintain his metFORMIN, aspirin, nitroGLYCERIN, gabapentin, tamsulosin, levothyroxine, NOVOLOG, Dulaglutide, clopidogrel, fosinopril, and pravastatin.  Meds ordered this encounter  Medications   . DISCONTD: ciprofloxacin (CIPRO) 500 MG tablet    Sig: Take 1 tablet (500 mg total) by mouth 2 (two) times daily.    Dispense:  20 tablet    Refill:  0     Follow-up: Return if symptoms worsen or fail to improve.  Mechele ClaudeWarren Kyanna Mahrt, M.D.

## 2014-09-06 ENCOUNTER — Other Ambulatory Visit: Payer: Self-pay | Admitting: Family Medicine

## 2014-09-06 LAB — AEROBIC CULTURE

## 2014-09-06 MED ORDER — MINOCYCLINE HCL 100 MG PO CAPS: 100.0000 mg | ORAL_CAPSULE | Freq: Two times a day (BID) | ORAL | Status: DC

## 2014-09-18 ENCOUNTER — Encounter: Payer: Self-pay | Admitting: Family Medicine

## 2014-09-18 ENCOUNTER — Ambulatory Visit (INDEPENDENT_AMBULATORY_CARE_PROVIDER_SITE_OTHER): Payer: Medicare HMO | Admitting: Family Medicine

## 2014-09-18 VITALS — BP 98/49 | HR 70 | Temp 97.4°F | Ht 72.0 in | Wt 259.4 lb

## 2014-09-18 DIAGNOSIS — R5383 Other fatigue: Secondary | ICD-10-CM

## 2014-09-18 DIAGNOSIS — R609 Edema, unspecified: Secondary | ICD-10-CM

## 2014-09-18 DIAGNOSIS — E1165 Type 2 diabetes mellitus with hyperglycemia: Secondary | ICD-10-CM | POA: Diagnosis not present

## 2014-09-18 DIAGNOSIS — IMO0002 Reserved for concepts with insufficient information to code with codable children: Secondary | ICD-10-CM

## 2014-09-18 DIAGNOSIS — E785 Hyperlipidemia, unspecified: Secondary | ICD-10-CM | POA: Diagnosis not present

## 2014-09-18 DIAGNOSIS — I10 Essential (primary) hypertension: Secondary | ICD-10-CM | POA: Diagnosis not present

## 2014-09-18 DIAGNOSIS — E1142 Type 2 diabetes mellitus with diabetic polyneuropathy: Secondary | ICD-10-CM | POA: Diagnosis not present

## 2014-09-18 LAB — POCT CBC
Granulocyte percent: 56.2 %G (ref 37–80)
HEMATOCRIT: 39.9 % — AB (ref 43.5–53.7)
HEMOGLOBIN: 12.2 g/dL — AB (ref 14.1–18.1)
Lymph, poc: 1.8 (ref 0.6–3.4)
MCH: 27.6 pg (ref 27–31.2)
MCHC: 30.6 g/dL — AB (ref 31.8–35.4)
MCV: 90.1 fL (ref 80–97)
MPV: 8.2 fL (ref 0–99.8)
POC Granulocyte: 3 (ref 2–6.9)
POC LYMPH PERCENT: 34.4 %L (ref 10–50)
Platelet Count, POC: 268 10*3/uL (ref 142–424)
RBC: 4.43 M/uL — AB (ref 4.69–6.13)
RDW, POC: 13.8 %
WBC: 5.3 10*3/uL (ref 4.6–10.2)

## 2014-09-18 LAB — POCT GLYCOSYLATED HEMOGLOBIN (HGB A1C): Hemoglobin A1C: 9.7

## 2014-09-18 MED ORDER — FUROSEMIDE 40 MG PO TABS: 40.0000 mg | ORAL_TABLET | Freq: Every day | ORAL | Status: DC

## 2014-09-18 MED ORDER — INSULIN DETEMIR 100 UNIT/ML ~~LOC~~ SOLN: 60.0000 [IU] | Freq: Every day | SUBCUTANEOUS | Status: DC

## 2014-09-18 NOTE — Progress Notes (Signed)
Subjective:  Patient ID: William Sharp, male    DOB: [birth year]  Age: 74 y.o. MRN: [id number]  CC: Diabetes; Hyperlipidemia; Hypertension; and Coronary Artery Disease   HPI Leven Hoel presents for  follow-up of hypertension. Patient has no history of headache chest pain or shortness of breath or recent cough. Patient also denies symptoms of TIA such as numbness weakness lateralizing. Patient checks  blood pressure at home and has not had any elevated readings recently. Patient denies side effects from his medication. States taking it regularly.  Patient also  in for follow-up of elevated cholesterol. Doing well without complaints on current medication. Denies side effects of statin including myalgia and arthralgia and nausea. Also in today for liver function testing. Currently no chest pain, shortness of breath or other cardiovascular related symptoms noted.  Follow-up of diabetes. Patient does check blood sugar at home. Readings run between 50 and 417 Patient denies symptoms such as polyuria, polydipsia, excessive hunger, nausea 5 significant hypoglycemic spells noted last 2 weeks.Can hardly stand up takes orange juice Medications as noted below. Taking them regularly without complication/adverse reaction being reported today.    History Falcon has a past medical history of CAD (coronary artery disease); HTN (hypertension), benign; Diabetes mellitus; Hyperlipemia; Hypothyroidism; Obesity; and TIA (transient ischemic attack).   He has past surgical history that includes Coronary artery bypass graft; Tracheostomy; Amputation; and Circumcision (04/29/2014).   His family history includes Cancer in his brother, father, and mother.He reports that he quit smoking about 23 years ago. He does not have any smokeless tobacco history on file. His alcohol and drug histories are not on file.  Current Outpatient Prescriptions on File Prior to Visit  Medication Sig Dispense Refill  . aspirin 81 MG tablet  Take 81 mg by mouth daily.    . clopidogrel (PLAVIX) 75 MG tablet Take 1 tablet (75 mg total) by mouth daily. 30 tablet 3  . Dulaglutide (TRULICITY) 6.43 PI/9.5JO SOPN Inject 1 Dose into the skin once a week. 4 pen 5  . fosinopril (MONOPRIL) 20 MG tablet Take 1 tablet (20 mg total) by mouth daily. 30 tablet 3  . gabapentin (NEURONTIN) 300 MG capsule Take 300 mg by mouth at bedtime.    Marland Kitchen levothyroxine (SYNTHROID, LEVOTHROID) 50 MCG tablet Take 1 tablet (50 mcg total) by mouth daily. 30 tablet 1  . metFORMIN (GLUCOPHAGE) 500 MG tablet Take 500 mg by mouth 2 (two) times daily with a meal.    . pravastatin (PRAVACHOL) 40 MG tablet Take 1 tablet (40 mg total) by mouth daily. 30 tablet 3  . nitroGLYCERIN (NITROSTAT) 0.4 MG SL tablet Place 0.4 mg under the tongue every 5 (five) minutes as needed.    . tamsulosin (FLOMAX) 0.4 MG CAPS capsule Take 2 capsules (0.8 mg total) by mouth daily after supper. (Patient not taking: Reported on 09/18/2014) 60 capsule 3   No current facility-administered medications on file prior to visit.    ROS Review of Systems  Constitutional: Negative for fever, chills and diaphoresis.  HENT: Negative for congestion, rhinorrhea and sore throat.   Respiratory: Negative for cough, shortness of breath and wheezing.   Cardiovascular: Negative for chest pain.  Gastrointestinal: Negative for nausea, vomiting, abdominal pain, diarrhea, constipation and abdominal distention.  Genitourinary: Negative for dysuria and frequency.  Musculoskeletal: Negative for joint swelling and arthralgias.  Skin: Negative for rash.  Neurological: Positive for numbness (both feet). Negative for light-headedness and headaches.    Objective:  BP 98/49 mmHg  Pulse  70  Temp(Src) 97.4 F (36.3 C) (Oral)  Ht 6' (1.829 m)  Wt 259 lb 6.4 oz (117.663 kg)  BMI 35.17 kg/m2  BP Readings from Last 3 Encounters:  09/18/14 98/49  09/03/14 138/59  08/27/14 123/63    Wt Readings from Last 3 Encounters:    09/18/14 259 lb 6.4 oz (117.663 kg)  09/03/14 255 lb 6.4 oz (115.849 kg)  08/27/14 259 lb (117.482 kg)     Physical Exam  Constitutional: He is oriented to person, place, and time. He appears well-developed and well-nourished. No distress.  HENT:  Head: Normocephalic and atraumatic.  Right Ear: External ear normal.  Left Ear: External ear normal.  Nose: Nose normal.  Mouth/Throat: Oropharynx is clear and moist.  Eyes: Conjunctivae and EOM are normal. Pupils are equal, round, and reactive to light.  Neck: Normal range of motion. Neck supple. No thyromegaly present.  Cardiovascular: Normal rate, regular rhythm and normal heart sounds.   No murmur heard. Pulmonary/Chest: Effort normal and breath sounds normal. No respiratory distress. He has no wheezes. He has no rales.  Abdominal: Soft. Bowel sounds are normal. He exhibits no distension. There is no tenderness.  Lymphadenopathy:    He has no cervical adenopathy.  Neurological: He is alert and oriented to person, place, and time. He has normal reflexes.  Both feet essentially numb for monofilament evaluation at multiple dermatomes  Skin: Skin is warm and dry.  Psychiatric: He has a normal mood and affect. His behavior is normal. Judgment and thought content normal.    Lab Results  Component Value Date   HGBA1C 9.7 09/18/2014   HGBA1C 12.8 06/18/2014    Lab Results  Component Value Date   WBC 5.3 09/18/2014   HGB 12.2* 09/18/2014   HCT 39.9* 09/18/2014   GLUCOSE 563* 06/18/2014   CHOL 172 06/18/2014   TRIG 124 06/18/2014   HDL 32* 06/18/2014   LDLCALC 115* 06/18/2014   ALT 6 06/18/2014   AST 9 06/18/2014   NA 131* 06/18/2014   K 4.8 06/18/2014   CL 92* 06/18/2014   CREATININE 1.27 06/18/2014   BUN 24 06/18/2014   CO2 24 06/18/2014   TSH 1.400 06/18/2014   HGBA1C 9.7 09/18/2014    Patient was never admitted.  Assessment & Plan:   Hannan was seen today for diabetes, hyperlipidemia, hypertension and coronary  artery disease.  Diagnoses and all orders for this visit:  Uncontrolled diabetes mellitus Orders: -     POCT glycosylated hemoglobin (Hb A1C); Standing -     NMR, lipoprofile -     CMP14+EGFR; Standing -     Lipid panel; Standing -     POCT UA - Microalbumin -     POCT glycosylated hemoglobin (Hb A1C) -     CMP14+EGFR  Hyperlipemia Orders: -     NMR, lipoprofile -     Lipid panel; Standing  HTN (hypertension), benign Orders: -     POCT CBC; Standing -     CMP14+EGFR; Standing -     POCT CBC -     CMP14+EGFR  Other fatigue Orders: -     POCT CBC; Standing -     Thyroid Panel With TSH -     Vit D  25 hydroxy (rtn osteoporosis monitoring); Standing -     POCT CBC -     Vit D  25 hydroxy (rtn osteoporosis monitoring)  Edema Orders: -     Brain natriuretic peptide -  Ambulatory referral to [place]  Diabetic polyneuropathy associated with type 2 diabetes mellitus Orders: -     Ambulatory referral to [place]  Other orders -     insulin detemir (LEVEMIR) 100 UNIT/ML injection; Inject 0.6 mLs (60 Units total) into the skin at bedtime. -     furosemide (LASIX) 40 MG tablet; Take 1 tablet (40 mg total) by mouth daily.   I have discontinued Mr. Kozub NOVOLOG and minocycline. I am also having him start on insulin detemir and furosemide. Additionally, I am having him maintain his metFORMIN, aspirin, nitroGLYCERIN, gabapentin, tamsulosin, levothyroxine, Dulaglutide, clopidogrel, fosinopril, and pravastatin.  Meds ordered this encounter  Medications  . insulin detemir (LEVEMIR) 100 UNIT/ML injection    Sig: Inject 0.6 mLs (60 Units total) into the skin at bedtime.    Dispense:  10 mL    Refill:  11  . furosemide (LASIX) 40 MG tablet    Sig: Take 1 tablet (40 mg total) by mouth daily.    Dispense:  30 tablet    Refill:  3     Follow-up: Return in about 3 months (around 12/19/2014).  Claretta Fraise,  M.D.

## 2014-09-19 LAB — CMP14+EGFR
ALT: 11 IU/L (ref 0–44)
AST: 15 IU/L (ref 0–40)
Albumin/Globulin Ratio: 1.7 (ref 1.1–2.5)
Albumin: 3.8 g/dL (ref 3.5–4.8)
Alkaline Phosphatase: 65 IU/L (ref 39–117)
BUN/Creatinine Ratio: 21 (ref 10–22)
BUN: 23 mg/dL (ref 8–27)
Bilirubin Total: 0.4 mg/dL (ref 0.0–1.2)
CO2: 23 mmol/L (ref 18–29)
CREATININE: 1.1 mg/dL (ref 0.76–1.27)
Calcium: 9.2 mg/dL (ref 8.6–10.2)
Chloride: 99 mmol/L (ref 97–108)
GFR calc Af Amer: 76 mL/min/{1.73_m2} (ref >59)
GFR calc non Af Amer: 65 mL/min/{1.73_m2} (ref >59)
GLUCOSE: 152 mg/dL — AB (ref 65–99)
Globulin, Total: 2.2 g/dL (ref 1.5–4.5)
Potassium: 4.3 mmol/L (ref 3.5–5.2)
SODIUM: 137 mmol/L (ref 134–144)
TOTAL PROTEIN: 6 g/dL (ref 6.0–8.5)

## 2014-09-19 LAB — NMR, LIPOPROFILE
Cholesterol: 137 mg/dL (ref 100–199)
HDL CHOLESTEROL BY NMR: 36 mg/dL — AB (ref >39)
HDL Particle Number: 23.1 umol/L — ABNORMAL LOW (ref >=30.5)
LDL PARTICLE NUMBER: 1163 nmol/L — AB (ref <1000)
LDL Size: 20.8 nm (ref >20.5)
LDL-C: 81 mg/dL (ref 0–99)
LP-IR SCORE: 64 — AB (ref <=45)
Small LDL Particle Number: 587 nmol/L — ABNORMAL HIGH (ref <=527)
Triglycerides by NMR: 99 mg/dL (ref 0–149)

## 2014-09-19 LAB — THYROID PANEL WITH TSH
Free Thyroxine Index: 1.3 (ref 1.2–4.9)
T3 Uptake Ratio: 30 % (ref 24–39)
T4, Total: 4.3 ug/dL — ABNORMAL LOW (ref 4.5–12.0)
TSH: 2.25 u[IU]/mL (ref 0.450–4.500)

## 2014-09-19 LAB — VITAMIN D 25 HYDROXY (VIT D DEFICIENCY, FRACTURES): Vit D, 25-Hydroxy: 17.1 ng/mL — ABNORMAL LOW (ref 30.0–100.0)

## 2014-09-19 LAB — BRAIN NATRIURETIC PEPTIDE: BNP: 31.1 pg/mL (ref 0.0–100.0)

## 2014-09-21 ENCOUNTER — Other Ambulatory Visit: Payer: Self-pay | Admitting: Family Medicine

## 2014-09-21 MED ORDER — VITAMIN D (ERGOCALCIFEROL) 1.25 MG (50000 UNIT) PO CAPS: 50000.0000 [IU] | ORAL_CAPSULE | ORAL | Status: DC

## 2014-09-21 MED ORDER — PRAVASTATIN SODIUM 80 MG PO TABS: 80.0000 mg | ORAL_TABLET | Freq: Every day | ORAL | Status: DC

## 2014-10-01 ENCOUNTER — Encounter: Payer: Self-pay | Admitting: *Deleted

## 2014-10-19 ENCOUNTER — Encounter: Payer: Self-pay | Admitting: Family Medicine

## 2014-10-19 ENCOUNTER — Ambulatory Visit (INDEPENDENT_AMBULATORY_CARE_PROVIDER_SITE_OTHER): Payer: Medicare HMO | Admitting: Family Medicine

## 2014-10-19 VITALS — BP 106/56 | HR 63 | Temp 97.1°F | Ht 72.0 in | Wt 255.2 lb

## 2014-10-19 DIAGNOSIS — Z794 Long term (current) use of insulin: Secondary | ICD-10-CM | POA: Diagnosis not present

## 2014-10-19 DIAGNOSIS — E1165 Type 2 diabetes mellitus with hyperglycemia: Secondary | ICD-10-CM | POA: Diagnosis not present

## 2014-10-19 DIAGNOSIS — IMO0002 Reserved for concepts with insufficient information to code with codable children: Secondary | ICD-10-CM

## 2014-10-19 MED ORDER — DULAGLUTIDE 0.75 MG/0.5ML ~~LOC~~ SOAJ: 1.0000 | SUBCUTANEOUS | Status: DC

## 2014-10-19 MED ORDER — INSULIN DETEMIR 100 UNIT/ML ~~LOC~~ SOLN: 70.0000 [IU] | Freq: Every day | SUBCUTANEOUS | Status: DC

## 2014-10-19 MED ORDER — DULAGLUTIDE 1.5 MG/0.5ML ~~LOC~~ SOAJ: 1.5000 mg | SUBCUTANEOUS | Status: DC

## 2014-10-19 NOTE — Progress Notes (Signed)
Subjective:  Patient ID: William Sharp, male    DOB: 03-Apr-1941  Age: 74 y.o. MRN: [id number]  CC: Diabetes   HPI William Sharp presents forFollow-up of diabetes.Glucose running between 130- 400. Patient denies symptoms such as polyuria, polydipsia, excessive hunger, nausea One significant hypoglycemic spell noted. Based on sx. Weak, sweaty, shaky.Relief with eating.  Medications as noted below. Taking them regularly without complication/adverse reaction being reported today. However they are getting expensive for him.  History William Sharp has a past medical history of CAD (coronary artery disease); HTN (hypertension), benign; Diabetes mellitus; Hyperlipemia; Hypothyroidism; Obesity; and TIA (transient ischemic attack).   He has past surgical history that includes Coronary artery bypass graft; Tracheostomy; Amputation; and Circumcision (04/29/2014).   His family history includes Cancer in his brother, father, and mother.He reports that he quit smoking about 23 years ago. He does not have any smokeless tobacco history on file. His alcohol and drug histories are not on file.  Current Outpatient Prescriptions on File Prior to Visit  Medication Sig Dispense Refill  . aspirin 81 MG tablet Take 81 mg by mouth daily.    . clopidogrel (PLAVIX) 75 MG tablet Take 1 tablet (75 mg total) by mouth daily. 30 tablet 3  . fosinopril (MONOPRIL) 20 MG tablet Take 1 tablet (20 mg total) by mouth daily. 30 tablet 3  . furosemide (LASIX) 40 MG tablet Take 1 tablet (40 mg total) by mouth daily. 30 tablet 3  . gabapentin (NEURONTIN) 300 MG capsule Take 300 mg by mouth at bedtime.    Marland Kitchen levothyroxine (SYNTHROID, LEVOTHROID) 50 MCG tablet Take 1 tablet (50 mcg total) by mouth daily. 30 tablet 1  . metFORMIN (GLUCOPHAGE) 500 MG tablet Take 500 mg by mouth 2 (two) times daily with a meal.    . nitroGLYCERIN (NITROSTAT) 0.4 MG SL tablet Place 0.4 mg under the tongue every 5 (five) minutes as needed.    . pravastatin  (PRAVACHOL) 80 MG tablet Take 1 tablet (80 mg total) by mouth daily. 30 tablet 5  . tamsulosin (FLOMAX) 0.4 MG CAPS capsule Take 2 capsules (0.8 mg total) by mouth daily after supper. 60 capsule 3  . Vitamin D, Ergocalciferol, (DRISDOL) 50000 UNITS CAPS capsule Take 1 capsule (50,000 Units total) by mouth 2 (two) times a week. 16 capsule 0   No current facility-administered medications on file prior to visit.    ROS Review of Systems  Constitutional: Negative for fever, chills and diaphoresis.  HENT: Negative for congestion, rhinorrhea and sore throat.   Respiratory: Negative for cough, shortness of breath and wheezing.   Cardiovascular: Negative for chest pain.  Gastrointestinal: Negative for nausea, vomiting, abdominal pain, diarrhea, constipation and abdominal distention.  Genitourinary: Negative for dysuria and frequency.  Musculoskeletal: Negative for joint swelling and arthralgias.  Skin: Negative for rash.  Neurological: Negative for headaches.    Objective:  BP 106/56 mmHg  Pulse 63  Temp(Src) 97.1 F (36.2 C) (Oral)  Ht 6' (1.829 m)  Wt 255 lb 3.2 oz (115.758 kg)  BMI 34.60 kg/m2  BP Readings from Last 3 Encounters:  10/19/14 106/56  09/18/14 98/49  09/03/14 138/59    Wt Readings from Last 3 Encounters:  10/19/14 255 lb 3.2 oz (115.758 kg)  09/18/14 259 lb 6.4 oz (117.663 kg)  09/03/14 255 lb 6.4 oz (115.849 kg)     Physical Exam  Constitutional: He is oriented to person, place, and time. He appears well-developed and well-nourished. No distress.  HENT:  Head: Normocephalic and  atraumatic.  Right Ear: External ear normal.  Left Ear: External ear normal.  Nose: Nose normal.  Mouth/Throat: Oropharynx is clear and moist.  Eyes: Conjunctivae and EOM are normal. Pupils are equal, round, and reactive to light.  Neck: Normal range of motion. Neck supple. No thyromegaly present.  Cardiovascular: Normal rate, regular rhythm and normal heart sounds.   No murmur  heard. Pulmonary/Chest: Effort normal and breath sounds normal. No respiratory distress. He has no wheezes. He has no rales.  Abdominal: Soft. Bowel sounds are normal. He exhibits no distension. There is no tenderness.  Lymphadenopathy:    He has no cervical adenopathy.  Neurological: He is alert and oriented to person, place, and time. He has normal reflexes.  Skin: Skin is warm and dry.  Psychiatric: He has a normal mood and affect. His behavior is normal. Judgment and thought content normal.    Lab Results  Component Value Date   HGBA1C 9.7 09/18/2014   HGBA1C 12.8 06/18/2014    Lab Results  Component Value Date   WBC 5.3 09/18/2014   HGB 12.2* 09/18/2014   HCT 39.9* 09/18/2014   GLUCOSE 152* 09/18/2014   CHOL 137 09/18/2014   TRIG 99 09/18/2014   HDL 36* 09/18/2014   LDLCALC 115* 06/18/2014   ALT 11 09/18/2014   AST 15 09/18/2014   NA 137 09/18/2014   K 4.3 09/18/2014   CL 99 09/18/2014   CREATININE 1.10 09/18/2014   BUN 23 09/18/2014   CO2 23 09/18/2014   TSH 2.250 09/18/2014   HGBA1C 9.7 09/18/2014     Assessment & Plan:   William RuizJohn was seen today for diabetes.  Diagnoses and all orders for this visit:  Uncontrolled diabetes mellitus  Long-term insulin use  Other orders -     insulin detemir (LEVEMIR) 100 UNIT/ML injection; Inject 0.7 mLs (70 Units total) into the skin at bedtime. -     Discontinue: Dulaglutide (TRULICITY) 0.75 MG/0.5ML SOPN; Inject 1 Dose into the skin once a week. -     Dulaglutide (TRULICITY) 1.5 MG/0.5ML SOPN; Inject 1.5 mg into the skin once a week.  I have discontinued William Sharp's Dulaglutide and Dulaglutide. I have also changed his insulin detemir. Additionally, I am having him start on Dulaglutide. Lastly, I am having him maintain his metFORMIN, aspirin, nitroGLYCERIN, gabapentin, tamsulosin, levothyroxine, clopidogrel, fosinopril, furosemide, pravastatin, and Vitamin D (Ergocalciferol).  Meds ordered this encounter  Medications  .  insulin detemir (LEVEMIR) 100 UNIT/ML injection    Sig: Inject 0.7 mLs (70 Units total) into the skin at bedtime.    Dispense:  15 mL    Refill:  11  . DISCONTD: Dulaglutide (TRULICITY) 0.75 MG/0.5ML SOPN    Sig: Inject 1 Dose into the skin once a week.    Dispense:  4 pen    Refill:  5  . Dulaglutide (TRULICITY) 1.5 MG/0.5ML SOPN    Sig: Inject 1.5 mg into the skin once a week.    Dispense:  4 pen    Refill:  5     Follow-up: Return in about 3 months (around 01/19/2015) for diabetes.  Mechele ClaudeWarren Larine Fielding, M.D.

## 2014-11-12 ENCOUNTER — Encounter: Payer: Self-pay | Admitting: Family Medicine

## 2014-11-12 ENCOUNTER — Ambulatory Visit (INDEPENDENT_AMBULATORY_CARE_PROVIDER_SITE_OTHER): Payer: Medicare HMO | Admitting: Family Medicine

## 2014-11-12 VITALS — BP 115/57 | HR 62 | Temp 98.2°F | Ht 72.0 in | Wt 255.0 lb

## 2014-11-12 DIAGNOSIS — W57XXXA Bitten or stung by nonvenomous insect and other nonvenomous arthropods, initial encounter: Secondary | ICD-10-CM | POA: Diagnosis not present

## 2014-11-12 DIAGNOSIS — S30863A Insect bite (nonvenomous) of scrotum and testes, initial encounter: Secondary | ICD-10-CM | POA: Diagnosis not present

## 2014-11-12 DIAGNOSIS — B999 Unspecified infectious disease: Secondary | ICD-10-CM

## 2014-11-12 DIAGNOSIS — L738 Other specified follicular disorders: Secondary | ICD-10-CM | POA: Diagnosis not present

## 2014-11-12 MED ORDER — DOXYCYCLINE HYCLATE 100 MG PO TABS: 100.0000 mg | ORAL_TABLET | Freq: Two times a day (BID) | ORAL | Status: DC

## 2014-11-12 NOTE — Progress Notes (Signed)
Subjective:  Patient ID: William Sharp, male    DOB: 17-Jun-1940  Age: 74 y.o. MRN: [id number]  CC: Tick Removal   HPI William Sharp presents for tick present for 2 days. He was not able to get it off of his scrotum. There was another one present at the same site. Days ago he was able to pull it off himself. This one is enlarging and he wants the office soon as possible. Additionally there is a lesion on the buttocks that has been there off and on since 2013 and he would like to have some antibiotic for that.  History William Sharp has a past medical history of CAD (coronary artery disease); HTN (hypertension), benign; Diabetes mellitus; Hyperlipemia; Hypothyroidism; Obesity; and TIA (transient ischemic attack).   He has past surgical history that includes Coronary artery bypass graft; Tracheostomy; Amputation; and Circumcision (04/29/2014).   His family history includes Cancer in his brother, father, and mother.He reports that he quit smoking about 23 years ago. He does not have any smokeless tobacco history on file. His alcohol and drug histories are not on file.  Outpatient Prescriptions Prior to Visit  Medication Sig Dispense Refill  . aspirin 81 MG tablet Take 81 mg by mouth daily.    . clopidogrel (PLAVIX) 75 MG tablet Take 1 tablet (75 mg total) by mouth daily. 30 tablet 3  . Dulaglutide (TRULICITY) 1.5 MG/0.5ML SOPN Inject 1.5 mg into the skin once a week. 4 pen 5  . fosinopril (MONOPRIL) 20 MG tablet Take 1 tablet (20 mg total) by mouth daily. 30 tablet 3  . furosemide (LASIX) 40 MG tablet Take 1 tablet (40 mg total) by mouth daily. 30 tablet 3  . gabapentin (NEURONTIN) 300 MG capsule Take 300 mg by mouth at bedtime.    . insulin detemir (LEVEMIR) 100 UNIT/ML injection Inject 0.7 mLs (70 Units total) into the skin at bedtime. 15 mL 11  . levothyroxine (SYNTHROID, LEVOTHROID) 50 MCG tablet Take 1 tablet (50 mcg total) by mouth daily. 30 tablet 1  . metFORMIN (GLUCOPHAGE) 500 MG tablet Take 500  mg by mouth 2 (two) times daily with a meal.    . nitroGLYCERIN (NITROSTAT) 0.4 MG SL tablet Place 0.4 mg under the tongue every 5 (five) minutes as needed.    . pravastatin (PRAVACHOL) 80 MG tablet Take 1 tablet (80 mg total) by mouth daily. 30 tablet 5  . tamsulosin (FLOMAX) 0.4 MG CAPS capsule Take 2 capsules (0.8 mg total) by mouth daily after supper. 60 capsule 3  . Vitamin D, Ergocalciferol, (DRISDOL) 50000 UNITS CAPS capsule Take 1 capsule (50,000 Units total) by mouth 2 (two) times a week. 16 capsule 0   No facility-administered medications prior to visit.    ROS Review of Systems  Constitutional: Negative for fever, chills and activity change.  HENT: Negative for congestion.   Respiratory: Negative for cough.   Skin: Positive for wound. Negative for rash.    Objective:  BP 115/57 mmHg  Pulse 62  Temp(Src) 98.2 F (36.8 C) (Oral)  Ht 6' (1.829 m)  Wt 255 lb (115.667 kg)  BMI 34.58 kg/m2  BP Readings from Last 3 Encounters:  11/12/14 115/57  10/19/14 106/56  09/18/14 98/49    Wt Readings from Last 3 Encounters:  11/12/14 255 lb (115.667 kg)  10/19/14 255 lb 3.2 oz (115.758 kg)  09/18/14 259 lb 6.4 oz (117.663 kg)     Physical Exam  Constitutional: He is oriented to person, place, and time. He  appears well-developed and well-nourished.  HENT:  Head: Normocephalic.  Cardiovascular: Normal rate, regular rhythm and normal heart sounds.   Pulmonary/Chest: Effort normal. No respiratory distress.  Neurological: He is alert and oriented to person, place, and time.  Skin: Skin is warm and dry. Rash ( 3 x 6 cm Violaceous eruption central on buttocks) noted.  There is a partially engorged dog tick on the anterior scrotum measuring 6 mm. This was removed with gentle pull and the head of the tick was identified as attached to the body. There was minimal erythema and irritation at the site.  Psychiatric: He has a normal mood and affect.    Lab Results  Component Value  Date   HGBA1C 9.7 09/18/2014   HGBA1C 12.8 06/18/2014    Lab Results  Component Value Date   WBC 5.3 09/18/2014   HGB 12.2* 09/18/2014   HCT 39.9* 09/18/2014   GLUCOSE 152* 09/18/2014   CHOL 137 09/18/2014   TRIG 99 09/18/2014   HDL 36* 09/18/2014   LDLCALC 115* 06/18/2014   ALT 11 09/18/2014   AST 15 09/18/2014   NA 137 09/18/2014   K 4.3 09/18/2014   CL 99 09/18/2014   CREATININE 1.10 09/18/2014   BUN 23 09/18/2014   CO2 23 09/18/2014   TSH 2.250 09/18/2014   HGBA1C 9.7 09/18/2014    Patient was never admitted.  Assessment & Plan:   William Sharp was seen today for tick removal.  Diagnoses and all orders for this visit:  Tick bite of scrotum, initial encounter  Infectious folliculitis  Other orders -     doxycycline (VIBRA-TABS) 100 MG tablet; Take 1 tablet (100 mg total) by mouth 2 (two) times daily.  I am having William Sharp start on doxycycline. I am also having him maintain his metFORMIN, aspirin, nitroGLYCERIN, gabapentin, tamsulosin, levothyroxine, clopidogrel, fosinopril, furosemide, pravastatin, Vitamin D (Ergocalciferol), insulin detemir, and Dulaglutide.  Meds ordered this encounter  Medications  . doxycycline (VIBRA-TABS) 100 MG tablet    Sig: Take 1 tablet (100 mg total) by mouth 2 (two) times daily.    Dispense:  20 tablet    Refill:  0     Follow-up: No Follow-up on file.  William Sharp, M.D.

## 2014-12-23 ENCOUNTER — Ambulatory Visit (INDEPENDENT_AMBULATORY_CARE_PROVIDER_SITE_OTHER): Payer: Medicare HMO | Admitting: Family Medicine

## 2014-12-23 VITALS — BP 124/57 | HR 63 | Temp 98.0°F | Ht 72.0 in | Wt 249.0 lb

## 2014-12-23 DIAGNOSIS — M545 Low back pain, unspecified: Secondary | ICD-10-CM

## 2014-12-23 LAB — POCT URINALYSIS DIPSTICK
Bilirubin, UA: NEGATIVE
GLUCOSE UA: NEGATIVE
KETONES UA: NEGATIVE
LEUKOCYTES UA: NEGATIVE
Nitrite, UA: NEGATIVE
Protein, UA: NEGATIVE
SPEC GRAV UA: 1.015
UROBILINOGEN UA: NEGATIVE
pH, UA: 5

## 2014-12-23 LAB — POCT UA - MICROSCOPIC ONLY
Bacteria, U Microscopic: NEGATIVE
CRYSTALS, UR, HPF, POC: NEGATIVE
Casts, Ur, LPF, POC: NEGATIVE
Epithelial cells, urine per micros: NEGATIVE
Mucus, UA: NEGATIVE
RBC, URINE, MICROSCOPIC: NEGATIVE
WBC, UR, HPF, POC: NEGATIVE
YEAST UA: NEGATIVE

## 2014-12-23 MED ORDER — CYCLOBENZAPRINE HCL 10 MG PO TABS: 5.0000 mg | ORAL_TABLET | Freq: Every day | ORAL | Status: DC

## 2014-12-23 MED ORDER — ASPIRIN EC 325 MG PO TBEC: 325.0000 mg | DELAYED_RELEASE_TABLET | Freq: Every day | ORAL | Status: DC

## 2014-12-23 MED ORDER — DICLOFENAC SODIUM 75 MG PO TBEC: 75.0000 mg | DELAYED_RELEASE_TABLET | Freq: Two times a day (BID) | ORAL | Status: DC

## 2014-12-23 MED ORDER — DICLOFENAC SODIUM 75 MG PO TBEC
75.0000 mg | DELAYED_RELEASE_TABLET | Freq: Two times a day (BID) | ORAL | Status: DC
Start: 2014-12-23 — End: 2017-12-27

## 2014-12-23 NOTE — Patient Instructions (Signed)

## 2014-12-23 NOTE — Progress Notes (Signed)
Subjective:  Patient ID: William Sharp, male    DOB: Jun 16, 1940  Age: 74 y.o. MRN: [id number]  CC: Back Pain   HPI [name] presents for lower back pain. Points to the lumbar region midline and paraspinous muscular bilaterally. NKI. ONset 1 week ago. Pain is moderate in severity 6/10 with twisting to the left. However it is nearly completely relieved with rest and neutral position.  History [place] has a past medical history of CAD (coronary artery disease); HTN (hypertension), benign; Diabetes mellitus; Hyperlipemia; Hypothyroidism; Obesity; and TIA (transient ischemic attack).   He has past surgical history that includes Coronary artery bypass graft; Tracheostomy; Amputation; and Circumcision (04/29/2014).   His family history includes Cancer in his brother, father, and mother.He reports that he quit smoking about 23 years ago. He does not have any smokeless tobacco history on file. His alcohol and drug histories are not on file.  Outpatient Prescriptions Prior to Visit  Medication Sig Dispense Refill  . Dulaglutide (TRULICITY) 1.5 MG/0.5ML SOPN Inject 1.5 mg into the skin once a week. 4 pen 5  . fosinopril (MONOPRIL) 20 MG tablet Take 1 tablet (20 mg total) by mouth daily. 30 tablet 3  . furosemide (LASIX) 40 MG tablet Take 1 tablet (40 mg total) by mouth daily. 30 tablet 3  . gabapentin (NEURONTIN) 300 MG capsule Take 300 mg by mouth at bedtime.    . insulin detemir (LEVEMIR) 100 UNIT/ML injection Inject 0.7 mLs (70 Units total) into the skin at bedtime. 15 mL 11  . levothyroxine (SYNTHROID, LEVOTHROID) 50 MCG tablet Take 1 tablet (50 mcg total) by mouth daily. 30 tablet 1  . metFORMIN (GLUCOPHAGE) 500 MG tablet Take 500 mg by mouth 2 (two) times daily with a meal.    . nitroGLYCERIN (NITROSTAT) 0.4 MG SL tablet Place 0.4 mg under the tongue every 5 (five) minutes as needed.    . pravastatin (PRAVACHOL) 80 MG tablet Take 1 tablet (80 mg total) by mouth daily. 30 tablet 5  . tamsulosin  (FLOMAX) 0.4 MG CAPS capsule Take 2 capsules (0.8 mg total) by mouth daily after supper. 60 capsule 3  . Vitamin D, Ergocalciferol, (DRISDOL) 50000 UNITS CAPS capsule Take 1 capsule (50,000 Units total) by mouth 2 (two) times a week. 16 capsule 0  . aspirin 81 MG tablet Take 81 mg by mouth daily.    . clopidogrel (PLAVIX) 75 MG tablet Take 1 tablet (75 mg total) by mouth daily. 30 tablet 3  . doxycycline (VIBRA-TABS) 100 MG tablet Take 1 tablet (100 mg total) by mouth 2 (two) times daily. (Patient not taking: Reported on 12/23/2014) 20 tablet 0   No facility-administered medications prior to visit.    ROS Review of Systems  Constitutional: Negative for fever, chills and diaphoresis.  HENT: Negative for congestion, rhinorrhea and sore throat.   Respiratory: Negative for cough, shortness of breath and wheezing.   Cardiovascular: Negative for chest pain.  Gastrointestinal: Negative for nausea, vomiting, abdominal pain, diarrhea, constipation and abdominal distention.  Genitourinary: Negative for dysuria and frequency.  Musculoskeletal: Positive for back pain and arthralgias. Negative for joint swelling.  Skin: Negative for rash.  Neurological: Negative for headaches.  Psychiatric/Behavioral: Negative.     Objective:  BP 124/57 mmHg  Pulse 63  Temp(Src) 98 F (36.7 C) (Oral)  Ht 6' (1.829 m)  Wt 249 lb (112.946 kg)  BMI 33.76 kg/m2  BP Readings from Last 3 Encounters:  12/23/14 124/57  11/12/14 115/57  10/19/14 106/56  Wt Readings from Last 3 Encounters:  12/23/14 249 lb (112.946 kg)  11/12/14 255 lb (115.667 kg)  10/19/14 255 lb 3.2 oz (115.758 kg)     Physical Exam  Constitutional: He appears well-developed and well-nourished.  HENT:  Head: Normocephalic and atraumatic.  Right Ear: External ear normal.  Left Ear: External ear normal.  Mouth/Throat: No oropharyngeal exudate or posterior oropharyngeal erythema.  Eyes: Pupils are equal, round, and reactive to light.    Neck: Normal range of motion. Neck supple.  Cardiovascular: Normal rate and regular rhythm.   No murmur heard. Pulmonary/Chest: Breath sounds normal. No respiratory distress.  Abdominal: Bowel sounds are normal.  Musculoskeletal: He exhibits tenderness (:At the lHloweerower left lumbar to the sacroiliac area.).  Vitals reviewed.   Lab Results  Component Value Date   HGBA1C 9.7 09/18/2014   HGBA1C 12.8 06/18/2014    Lab Results  Component Value Date   WBC 5.3 09/18/2014   HGB 12.2* 09/18/2014   HCT 39.9* 09/18/2014   GLUCOSE 152* 09/18/2014   CHOL 137 09/18/2014   TRIG 99 09/18/2014   HDL 36* 09/18/2014   LDLCALC 115* 06/18/2014   ALT 11 09/18/2014   AST 15 09/18/2014   NA 137 09/18/2014   K 4.3 09/18/2014   CL 99 09/18/2014   CREATININE 1.10 09/18/2014   BUN 23 09/18/2014   CO2 23 09/18/2014   TSH 2.250 09/18/2014   HGBA1C 9.7 09/18/2014    Patient was never admitted.  Assessment & Plan:   Yaniv was seen today for back pain.  Diagnoses and all orders for this visit:  Left-sided low back pain without sciatica -     POCT urinalysis dipstick -     POCT UA - Microscopic Only  Other orders -     aspirin EC 325 MG tablet; Take 1 tablet (325 mg total) by mouth daily. -     Discontinue: cyclobenzaprine (FLEXERIL) 10 MG tablet; Take 0.5 tablets (5 mg total) by mouth at bedtime. -     Discontinue: diclofenac (VOLTAREN) 75 MG EC tablet; Take 1 tablet (75 mg total) by mouth 2 (two) times daily. -     cyclobenzaprine (FLEXERIL) 10 MG tablet; Take 0.5 tablets (5 mg total) by mouth at bedtime. -     diclofenac (VOLTAREN) 75 MG EC tablet; Take 1 tablet (75 mg total) by mouth 2 (two) times daily.   I have discontinued Mr. Moen aspirin, clopidogrel, and doxycycline. I am also having him start on aspirin EC. Additionally, I am having him maintain his metFORMIN, nitroGLYCERIN, gabapentin, tamsulosin, levothyroxine, fosinopril, furosemide, pravastatin, Vitamin D  (Ergocalciferol), insulin detemir, Dulaglutide, cyclobenzaprine, and diclofenac.  Meds ordered this encounter  Medications  . aspirin EC 325 MG tablet    Sig: Take 1 tablet (325 mg total) by mouth daily.    Dispense:  30 tablet    Refill:  0  . DISCONTD: cyclobenzaprine (FLEXERIL) 10 MG tablet    Sig: Take 0.5 tablets (5 mg total) by mouth at bedtime.    Dispense:  30 tablet    Refill:  1  . DISCONTD: diclofenac (VOLTAREN) 75 MG EC tablet    Sig: Take 1 tablet (75 mg total) by mouth 2 (two) times daily.    Dispense:  60 tablet    Refill:  2  . cyclobenzaprine (FLEXERIL) 10 MG tablet    Sig: Take 0.5 tablets (5 mg total) by mouth at bedtime.    Dispense:  30 tablet    Refill:  1  . diclofenac (VOLTAREN) 75 MG EC tablet    Sig: Take 1 tablet (75 mg total) by mouth 2 (two) times daily.    Dispense:  60 tablet    Refill:  2   Back exercise handout given  Follow-up: No Follow-up on file.  Mechele Claude, M.D.

## 2014-12-26 ENCOUNTER — Encounter: Payer: Self-pay | Admitting: Family Medicine

## 2015-01-21 ENCOUNTER — Ambulatory Visit (INDEPENDENT_AMBULATORY_CARE_PROVIDER_SITE_OTHER): Payer: Medicare HMO | Admitting: Family Medicine

## 2015-01-21 ENCOUNTER — Encounter: Payer: Self-pay | Admitting: Family Medicine

## 2015-01-21 VITALS — BP 114/56 | HR 59 | Temp 97.5°F | Ht 72.0 in | Wt 246.4 lb

## 2015-01-21 DIAGNOSIS — I25118 Atherosclerotic heart disease of native coronary artery with other forms of angina pectoris: Secondary | ICD-10-CM

## 2015-01-21 DIAGNOSIS — E785 Hyperlipidemia, unspecified: Secondary | ICD-10-CM | POA: Diagnosis not present

## 2015-01-21 DIAGNOSIS — R5383 Other fatigue: Secondary | ICD-10-CM

## 2015-01-21 DIAGNOSIS — E038 Other specified hypothyroidism: Secondary | ICD-10-CM

## 2015-01-21 DIAGNOSIS — E1165 Type 2 diabetes mellitus with hyperglycemia: Secondary | ICD-10-CM | POA: Diagnosis not present

## 2015-01-21 DIAGNOSIS — I10 Essential (primary) hypertension: Secondary | ICD-10-CM | POA: Diagnosis not present

## 2015-01-21 DIAGNOSIS — E669 Obesity, unspecified: Secondary | ICD-10-CM | POA: Diagnosis not present

## 2015-01-21 DIAGNOSIS — IMO0002 Reserved for concepts with insufficient information to code with codable children: Secondary | ICD-10-CM

## 2015-01-21 LAB — GLUCOSE, POCT (MANUAL RESULT ENTRY): POC Glucose: 378 mg/dl — AB (ref 70–99)

## 2015-01-21 LAB — POCT GLYCOSYLATED HEMOGLOBIN (HGB A1C): HEMOGLOBIN A1C: 14

## 2015-01-21 MED ORDER — PRAVASTATIN SODIUM 80 MG PO TABS: 80.0000 mg | ORAL_TABLET | Freq: Every day | ORAL | Status: DC

## 2015-01-21 MED ORDER — FOSINOPRIL SODIUM 20 MG PO TABS: 20.0000 mg | ORAL_TABLET | Freq: Every day | ORAL | Status: DC

## 2015-01-21 MED ORDER — INSULIN NPH ISOPHANE & REGULAR (70-30) 100 UNIT/ML ~~LOC~~ SUSP: SUBCUTANEOUS | Status: DC

## 2015-01-21 MED ORDER — LEVOTHYROXINE SODIUM 50 MCG PO TABS: 50.0000 ug | ORAL_TABLET | Freq: Every day | ORAL | Status: DC

## 2015-01-21 NOTE — Progress Notes (Signed)
Subjective:  Patient ID: William Sharp, male    DOB: 1940/10/21  Age: 74 y.o. MRN: [id number]  CC: No chief complaint on file.   HPI William Sharp presents for  follow-up of hypertension. Patient has no history of headache chest pain or shortness of breath or recent cough. Patient also denies symptoms of TIA such as numbness weakness lateralizing. Patient checks  blood pressure at home and has not had any elevated readings recently. Patient denies side effects from his medication. States taking it regularly.  Patient also  in for follow-up of elevated cholesterol. Doing well without complaints on current medication. Denies side effects of statin including myalgia and arthralgia and nausea. Also in today for liver function testing. Currently no chest pain, shortness of breath or other cardiovascular related symptoms noted.  Follow-up of diabetes. Patient does NOT check blood sugar at home regularly. Readings TOO HIGH when taken Patient denies symptoms such as polyuria, polydipsia, excessive hunger, nausea No significant hypoglycemic spells noted. Medications as noted below. Not taking because he can't afford him since he hit the [employer] hole.  Patient presents for follow-up on  thyroid. She has a history of hypothyroidism for many years. It has been stable recently. Pt. denies any change in  voice, loss of hair, heat or cold intolerance. Energy level has been adequate to good. She denies constipation and diarrhea. No myxedema. Medication is as noted below. Verified that pt is taking it daily on an empty stomach. Well tolerated.   History William Sharp has a past medical history of CAD (coronary artery disease); HTN (hypertension), benign; Diabetes mellitus; Hyperlipemia; Hypothyroidism; Obesity; and TIA (transient ischemic attack).   He has past surgical history that includes Coronary artery bypass graft; Tracheostomy; Amputation; and Circumcision (04/29/2014).   His family history includes Cancer in  his brother, father, and mother.He reports that he quit smoking about 23 years ago. He does not have any smokeless tobacco history on file. His alcohol and drug histories are not on file.  Current Outpatient Prescriptions on File Prior to Visit  Medication Sig Dispense Refill  . aspirin EC 325 MG tablet Take 1 tablet (325 mg total) by mouth daily. 30 tablet 0  . diclofenac (VOLTAREN) 75 MG EC tablet Take 1 tablet (75 mg total) by mouth 2 (two) times daily. 60 tablet 2  . furosemide (LASIX) 40 MG tablet Take 1 tablet (40 mg total) by mouth daily. 30 tablet 3  . metFORMIN (GLUCOPHAGE) 500 MG tablet Take 500 mg by mouth 2 (two) times daily with a meal.    . nitroGLYCERIN (NITROSTAT) 0.4 MG SL tablet Place 0.4 mg under the tongue every 5 (five) minutes as needed.    . cyclobenzaprine (FLEXERIL) 10 MG tablet Take 0.5 tablets (5 mg total) by mouth at bedtime. (Patient not taking: Reported on 01/21/2015) 30 tablet 1  . gabapentin (NEURONTIN) 300 MG capsule Take 300 mg by mouth at bedtime.    . tamsulosin (FLOMAX) 0.4 MG CAPS capsule Take 2 capsules (0.8 mg total) by mouth daily after supper. (Patient not taking: Reported on 01/21/2015) 60 capsule 3  . Vitamin D, Ergocalciferol, (DRISDOL) 50000 UNITS CAPS capsule Take 1 capsule (50,000 Units total) by mouth 2 (two) times a week. (Patient not taking: Reported on 01/21/2015) 16 capsule 0   No current facility-administered medications on file prior to visit.    ROS Review of Systems  Constitutional: Negative for fever, chills and diaphoresis.  HENT: Negative for congestion, rhinorrhea and sore throat.   Respiratory:  Negative for cough, shortness of breath and wheezing.   Cardiovascular: Negative for chest pain.  Gastrointestinal: Negative for nausea, vomiting, abdominal pain, diarrhea, constipation and abdominal distention.  Genitourinary: Negative for dysuria and frequency.  Musculoskeletal: Negative for joint swelling and arthralgias.  Skin: Negative for  rash.  Neurological: Negative for headaches.    Objective:  BP 114/56 mmHg  Pulse 59  Temp(Src) 97.5 F (36.4 C) (Oral)  Ht 6' (1.829 m)  Wt 246 lb 6.4 oz (111.766 kg)  BMI 33.41 kg/m2  BP Readings from Last 3 Encounters:  01/21/15 114/56  12/23/14 124/57  11/12/14 115/57    Wt Readings from Last 3 Encounters:  01/21/15 246 lb 6.4 oz (111.766 kg)  12/23/14 249 lb (112.946 kg)  11/12/14 255 lb (115.667 kg)     Physical Exam  Constitutional: He is oriented to person, place, and time. He appears well-developed and well-nourished. No distress.  HENT:  Head: Normocephalic and atraumatic.  Right Ear: External ear normal.  Left Ear: External ear normal.  Nose: Nose normal.  Mouth/Throat: Oropharynx is clear and moist.  Eyes: Conjunctivae and EOM are normal. Pupils are equal, round, and reactive to light.  Neck: Normal range of motion. Neck supple. No thyromegaly present.  Cardiovascular: Normal rate, regular rhythm and normal heart sounds.   No murmur heard. Pulmonary/Chest: Effort normal and breath sounds normal. No respiratory distress. He has no wheezes. He has no rales.  Abdominal: Soft. Bowel sounds are normal. He exhibits no distension. There is no tenderness.  Lymphadenopathy:    He has no cervical adenopathy.  Neurological: He is alert and oriented to person, place, and time. He has normal reflexes.  Skin: Skin is warm and dry.  Psychiatric: He has a normal mood and affect. His behavior is normal. Judgment and thought content normal.    Lab Results  Component Value Date   HGBA1C 14.0 01/21/2015   HGBA1C 9.7 09/18/2014   HGBA1C 12.8 06/18/2014    Lab Results  Component Value Date   WBC 4.8 01/21/2015   HGB 12.2* 09/18/2014   HCT 41.5 01/21/2015   GLUCOSE 376* 01/21/2015   CHOL 176 01/21/2015   TRIG 139 01/21/2015   HDL 33* 01/21/2015   LDLCALC 115* 01/21/2015   ALT 9 01/21/2015   AST 8 01/21/2015   NA 137 01/21/2015   K 4.8 01/21/2015   CL 94*  01/21/2015   CREATININE 1.14 01/21/2015   BUN 18 01/21/2015   CO2 27 01/21/2015   TSH 2.250 09/18/2014   HGBA1C 14.0 01/21/2015    Patient was never admitted.  Assessment & Plan:   Diagnoses and all orders for this visit:  Uncontrolled diabetes mellitus -     POCT glycosylated hemoglobin (Hb A1C) -     CMP14+EGFR -     Lipid panel -     CBC with Differential/Platelet -     POCT glucose (manual entry)  HTN (hypertension), benign -     Cancel: POCT CBC -     CMP14+EGFR -     CBC with Differential/Platelet  Other fatigue -     Cancel: POCT CBC -     CBC with Differential/Platelet  Hyperlipemia -     Lipid panel -     CBC with Differential/Platelet  Obesity  Coronary artery disease involving native coronary artery of native heart with other form of angina pectoris  Other specified hypothyroidism  Other orders -     fosinopril (MONOPRIL) 20 MG tablet; Take 1  tablet (20 mg total) by mouth daily. -     levothyroxine (SYNTHROID, LEVOTHROID) 50 MCG tablet; Take 1 tablet (50 mcg total) by mouth daily. -     pravastatin (PRAVACHOL) 80 MG tablet; Take 1 tablet (80 mg total) by mouth daily. -     Discontinue: insulin NPH-regular Human (NOVOLIN 70/30) (70-30) 100 UNIT/ML injection; 40 units before breakfast and 30 units before supper -     insulin NPH-regular Human (NOVOLIN 70/30) (70-30) 100 UNIT/ML injection; 40 units before breakfast and 30 units before supper   I have discontinued William Sharp insulin detemir and Dulaglutide. I am also having him maintain his metFORMIN, nitroGLYCERIN, gabapentin, tamsulosin, furosemide, Vitamin D (Ergocalciferol), aspirin EC, cyclobenzaprine, diclofenac, fosinopril, levothyroxine, pravastatin, and insulin NPH-regular Human.  Meds ordered this encounter  Medications  . fosinopril (MONOPRIL) 20 MG tablet    Sig: Take 1 tablet (20 mg total) by mouth daily.    Dispense:  30 tablet    Refill:  3  . levothyroxine (SYNTHROID, LEVOTHROID) 50 MCG  tablet    Sig: Take 1 tablet (50 mcg total) by mouth daily.    Dispense:  30 tablet    Refill:  5  . pravastatin (PRAVACHOL) 80 MG tablet    Sig: Take 1 tablet (80 mg total) by mouth daily.    Dispense:  30 tablet    Refill:  5  . DISCONTD: insulin NPH-regular Human (NOVOLIN 70/30) (70-30) 100 UNIT/ML injection    Sig: 40 units before breakfast and 30 units before supper    Dispense:  20 mL    Refill:  11  . insulin NPH-regular Human (NOVOLIN 70/30) (70-30) 100 UNIT/ML injection    Sig: 40 units before breakfast and 30 units before supper    Dispense:  20 mL    Refill:  11     Follow-up: Return in about 3 months (around 04/22/2015).  Claretta Fraise, M.D.

## 2015-01-22 LAB — CBC WITH DIFFERENTIAL/PLATELET
BASOS ABS: 0 10*3/uL (ref 0.0–0.2)
Basos: 1 %
EOS (ABSOLUTE): 0.2 10*3/uL (ref 0.0–0.4)
EOS: 3 %
Hematocrit: 41.5 % (ref 37.5–51.0)
Hemoglobin: 13.1 g/dL (ref 12.6–17.7)
IMMATURE GRANULOCYTES: 0 %
Immature Grans (Abs): 0 10*3/uL (ref 0.0–0.1)
LYMPHS ABS: 1.5 10*3/uL (ref 0.7–3.1)
Lymphs: 30 %
MCH: 29 pg (ref 26.6–33.0)
MCHC: 31.6 g/dL (ref 31.5–35.7)
MCV: 92 fL (ref 79–97)
MONOS ABS: 0.4 10*3/uL (ref 0.1–0.9)
Monocytes: 7 %
NEUTROS PCT: 59 %
Neutrophils Absolute: 2.8 10*3/uL (ref 1.4–7.0)
PLATELETS: 229 10*3/uL (ref 150–379)
RBC: 4.51 x10E6/uL (ref 4.14–5.80)
RDW: 13.5 % (ref 12.3–15.4)
WBC: 4.8 10*3/uL (ref 3.4–10.8)

## 2015-01-22 LAB — CMP14+EGFR
A/G RATIO: 1.7 (ref 1.1–2.5)
ALBUMIN: 4 g/dL (ref 3.5–4.8)
ALT: 9 IU/L (ref 0–44)
AST: 8 IU/L (ref 0–40)
Alkaline Phosphatase: 76 IU/L (ref 39–117)
BILIRUBIN TOTAL: 0.3 mg/dL (ref 0.0–1.2)
BUN/Creatinine Ratio: 16 (ref 10–22)
BUN: 18 mg/dL (ref 8–27)
CO2: 27 mmol/L (ref 18–29)
Calcium: 9.4 mg/dL (ref 8.6–10.2)
Chloride: 94 mmol/L — ABNORMAL LOW (ref 97–108)
Creatinine, Ser: 1.14 mg/dL (ref 0.76–1.27)
GFR calc non Af Amer: 63 mL/min/{1.73_m2} (ref >59)
GFR, EST AFRICAN AMERICAN: 72 mL/min/{1.73_m2} (ref >59)
Globulin, Total: 2.3 g/dL (ref 1.5–4.5)
Glucose: 376 mg/dL — ABNORMAL HIGH (ref 65–99)
POTASSIUM: 4.8 mmol/L (ref 3.5–5.2)
SODIUM: 137 mmol/L (ref 134–144)
TOTAL PROTEIN: 6.3 g/dL (ref 6.0–8.5)

## 2015-01-22 LAB — LIPID PANEL
Chol/HDL Ratio: 5.3 ratio units — ABNORMAL HIGH (ref 0.0–5.0)
Cholesterol, Total: 176 mg/dL (ref 100–199)
HDL: 33 mg/dL — ABNORMAL LOW (ref >39)
LDL Calculated: 115 mg/dL — ABNORMAL HIGH (ref 0–99)
Triglycerides: 139 mg/dL (ref 0–149)
VLDL Cholesterol Cal: 28 mg/dL (ref 5–40)

## 2015-02-23 ENCOUNTER — Ambulatory Visit (INDEPENDENT_AMBULATORY_CARE_PROVIDER_SITE_OTHER): Payer: Medicare HMO | Admitting: Family Medicine

## 2015-02-23 ENCOUNTER — Encounter: Payer: Self-pay | Admitting: Family Medicine

## 2015-02-23 VITALS — BP 124/67 | HR 78 | Temp 96.8°F | Ht 72.0 in | Wt 258.4 lb

## 2015-02-23 DIAGNOSIS — E038 Other specified hypothyroidism: Secondary | ICD-10-CM | POA: Diagnosis not present

## 2015-02-23 DIAGNOSIS — IMO0001 Reserved for inherently not codable concepts without codable children: Secondary | ICD-10-CM

## 2015-02-23 DIAGNOSIS — Z794 Long term (current) use of insulin: Secondary | ICD-10-CM | POA: Diagnosis not present

## 2015-02-23 DIAGNOSIS — E1165 Type 2 diabetes mellitus with hyperglycemia: Secondary | ICD-10-CM | POA: Diagnosis not present

## 2015-02-23 DIAGNOSIS — I10 Essential (primary) hypertension: Secondary | ICD-10-CM | POA: Diagnosis not present

## 2015-02-23 NOTE — Progress Notes (Signed)
Subjective:  Patient ID: William Sharp, male    DOB: 1941-03-13  Age: 74 y.o. MRN: [id number]  CC: Diabetes   HPI William Sharp presents for  follow-up of hypertension. Patient has no history of headache chest pain or shortness of breath or recent cough. Patient also denies symptoms of TIA such as numbness weakness lateralizing. Patient checks  blood pressure at home and has not had any elevated readings recently. Patient denies side effects from his medication. States taking it regularly.  Follow-up of diabetes. Patient does check blood sugar at home. Readings run between 470  and 145.  Patient denies symptoms such as polyuria, polydipsia, excessive hunger, nausea One significant hypoglycemic spell noted. Shaky, nose went numb, sweating heavily.  Medications as noted below. Taking them regularly but ran out novolog from 9-26 to 9-30. Couldn't get the 70/30 until 9-30 History William Sharp has a past medical history of CAD (coronary artery disease); HTN (hypertension), benign; Diabetes mellitus; Hyperlipemia; Hypothyroidism; Obesity; and TIA (transient ischemic attack).   He has past surgical history that includes Coronary artery bypass graft; Tracheostomy; Amputation; and Circumcision (04/29/2014).   His family history includes Cancer in his brother, father, and mother.He reports that he quit smoking about 23 years ago. He does not have any smokeless tobacco history on file. His alcohol and drug histories are not on file.  Current Outpatient Prescriptions on File Prior to Visit  Medication Sig Dispense Refill  . aspirin EC 325 MG tablet Take 1 tablet (325 mg total) by mouth daily. 30 tablet 0  . cyclobenzaprine (FLEXERIL) 10 MG tablet Take 0.5 tablets (5 mg total) by mouth at bedtime. 30 tablet 1  . diclofenac (VOLTAREN) 75 MG EC tablet Take 1 tablet (75 mg total) by mouth 2 (two) times daily. 60 tablet 2  . fosinopril (MONOPRIL) 20 MG tablet Take 1 tablet (20 mg total) by mouth daily. 30 tablet 3    . furosemide (LASIX) 40 MG tablet Take 1 tablet (40 mg total) by mouth daily. 30 tablet 3  . gabapentin (NEURONTIN) 300 MG capsule Take 300 mg by mouth at bedtime.    . insulin NPH-regular Human (NOVOLIN 70/30) (70-30) 100 UNIT/ML injection 40 units before breakfast and 30 units before supper 20 mL 11  . levothyroxine (SYNTHROID, LEVOTHROID) 50 MCG tablet Take 1 tablet (50 mcg total) by mouth daily. 30 tablet 5  . metFORMIN (GLUCOPHAGE) 500 MG tablet Take 500 mg by mouth 2 (two) times daily with a meal.    . nitroGLYCERIN (NITROSTAT) 0.4 MG SL tablet Place 0.4 mg under the tongue every 5 (five) minutes as needed.    . pravastatin (PRAVACHOL) 80 MG tablet Take 1 tablet (80 mg total) by mouth daily. 30 tablet 5  . tamsulosin (FLOMAX) 0.4 MG CAPS capsule Take 2 capsules (0.8 mg total) by mouth daily after supper. 60 capsule 3  . Vitamin D, Ergocalciferol, (DRISDOL) 50000 UNITS CAPS capsule Take 1 capsule (50,000 Units total) by mouth 2 (two) times a week. 16 capsule 0   No current facility-administered medications on file prior to visit.    ROS Review of Systems  Constitutional: Negative for fever, chills and diaphoresis.  HENT: Negative for congestion, rhinorrhea and sore throat.   Respiratory: Negative for cough, shortness of breath and wheezing.   Cardiovascular: Negative for chest pain.  Gastrointestinal: Negative for nausea, vomiting, abdominal pain, diarrhea, constipation and abdominal distention.  Genitourinary: Negative for dysuria and frequency.  Musculoskeletal: Negative for joint swelling and arthralgias.  Skin: Negative  for rash.  Neurological: Negative for headaches.    Objective:  BP 124/67 mmHg  Pulse 78  Temp(Src) 96.8 F (36 C) (Oral)  Ht 6' (1.829 m)  Wt 258 lb 6.4 oz (117.209 kg)  BMI 35.04 kg/m2  BP Readings from Last 3 Encounters:  02/23/15 124/67  01/21/15 114/56  12/23/14 124/57    Wt Readings from Last 3 Encounters:  02/23/15 258 lb 6.4 oz (117.209 kg)   01/21/15 246 lb 6.4 oz (111.766 kg)  12/23/14 249 lb (112.946 kg)     Physical Exam  Constitutional: He is oriented to person, place, and time. He appears well-developed and well-nourished. No distress.  HENT:  Head: Normocephalic and atraumatic.  Right Ear: External ear normal.  Left Ear: External ear normal.  Nose: Nose normal.  Mouth/Throat: Oropharynx is clear and moist.  Eyes: Conjunctivae and EOM are normal. Pupils are equal, round, and reactive to light.  Neck: Normal range of motion. Neck supple. No thyromegaly present.  Cardiovascular: Normal rate, regular rhythm and normal heart sounds.   No murmur heard. Pulmonary/Chest: Effort normal and breath sounds normal. No respiratory distress. He has no wheezes. He has no rales.  Abdominal: Soft. Bowel sounds are normal. He exhibits no distension. There is no tenderness.  Lymphadenopathy:    He has no cervical adenopathy.  Neurological: He is alert and oriented to person, place, and time. He has normal reflexes.  Skin: Skin is warm and dry.  Psychiatric: He has a normal mood and affect. His behavior is normal. Judgment and thought content normal.    Lab Results  Component Value Date   HGBA1C 14.0 01/21/2015   HGBA1C 9.7 09/18/2014   HGBA1C 12.8 06/18/2014    Lab Results  Component Value Date   WBC 4.8 01/21/2015   HGB 12.2* 09/18/2014   HCT 41.5 01/21/2015   GLUCOSE 376* 01/21/2015   CHOL 176 01/21/2015   TRIG 139 01/21/2015   HDL 33* 01/21/2015   LDLCALC 115* 01/21/2015   ALT 9 01/21/2015   AST 8 01/21/2015   NA 137 01/21/2015   K 4.8 01/21/2015   CL 94* 01/21/2015   CREATININE 1.14 01/21/2015   BUN 18 01/21/2015   CO2 27 01/21/2015   TSH 2.250 09/18/2014   HGBA1C 14.0 01/21/2015    Patient was never admitted.  Assessment & Plan:   William Sharp was seen today for diabetes.  Diagnoses and all orders for this visit:  Uncontrolled type 2 diabetes mellitus without complication, with long-term current use of  insulin (HCC)  Other specified hypothyroidism  HTN (hypertension), benign  Long-term insulin use (HCC)   I am having William Sharp maintain his metFORMIN, nitroGLYCERIN, gabapentin, tamsulosin, furosemide, Vitamin D (Ergocalciferol), aspirin EC, cyclobenzaprine, diclofenac, fosinopril, levothyroxine, pravastatin, and insulin NPH-regular Human.  No orders of the defined types were placed in this encounter.    Goal for glucose at home 110 before breakfast and under 160  Two hours after biggest meal.   Follow-up: Return in about 2 months (around 04/25/2015) for diabetes.  Mechele Claude, M.D.

## 2015-02-23 NOTE — Patient Instructions (Signed)
Goal for glucose at home 110 before breakfast and under 160  Two hours after biggest meal.

## 2015-04-22 ENCOUNTER — Other Ambulatory Visit: Payer: Medicare HMO

## 2015-04-23 ENCOUNTER — Other Ambulatory Visit (INDEPENDENT_AMBULATORY_CARE_PROVIDER_SITE_OTHER): Payer: Medicare HMO

## 2015-04-23 DIAGNOSIS — E1165 Type 2 diabetes mellitus with hyperglycemia: Secondary | ICD-10-CM | POA: Diagnosis not present

## 2015-04-23 DIAGNOSIS — IMO0002 Reserved for concepts with insufficient information to code with codable children: Secondary | ICD-10-CM

## 2015-04-23 DIAGNOSIS — R5383 Other fatigue: Secondary | ICD-10-CM

## 2015-04-23 DIAGNOSIS — E785 Hyperlipidemia, unspecified: Secondary | ICD-10-CM

## 2015-04-23 DIAGNOSIS — I10 Essential (primary) hypertension: Secondary | ICD-10-CM

## 2015-04-23 LAB — POCT GLYCOSYLATED HEMOGLOBIN (HGB A1C): HEMOGLOBIN A1C: 11.3

## 2015-04-23 NOTE — Progress Notes (Signed)
Lab only 

## 2015-04-24 LAB — CBC WITH DIFFERENTIAL/PLATELET
BASOS ABS: 0.1 10*3/uL (ref 0.0–0.2)
Basos: 1 %
EOS (ABSOLUTE): 0.1 10*3/uL (ref 0.0–0.4)
EOS: 3 %
HEMATOCRIT: 41 % (ref 37.5–51.0)
HEMOGLOBIN: 13.6 g/dL (ref 12.6–17.7)
IMMATURE GRANS (ABS): 0 10*3/uL (ref 0.0–0.1)
IMMATURE GRANULOCYTES: 0 %
LYMPHS: 34 %
Lymphocytes Absolute: 1.6 10*3/uL (ref 0.7–3.1)
MCH: 30.2 pg (ref 26.6–33.0)
MCHC: 33.2 g/dL (ref 31.5–35.7)
MCV: 91 fL (ref 79–97)
MONOCYTES: 8 %
Monocytes Absolute: 0.4 10*3/uL (ref 0.1–0.9)
NEUTROS PCT: 54 %
Neutrophils Absolute: 2.6 10*3/uL (ref 1.4–7.0)
Platelets: 197 10*3/uL (ref 150–379)
RBC: 4.5 x10E6/uL (ref 4.14–5.80)
RDW: 13.5 % (ref 12.3–15.4)
WBC: 4.8 10*3/uL (ref 3.4–10.8)

## 2015-04-24 LAB — VITAMIN D 25 HYDROXY (VIT D DEFICIENCY, FRACTURES): VIT D 25 HYDROXY: 40.4 ng/mL (ref 30.0–100.0)

## 2015-04-24 LAB — LIPID PANEL
Chol/HDL Ratio: 4.6 ratio units (ref 0.0–5.0)
Cholesterol, Total: 165 mg/dL (ref 100–199)
HDL: 36 mg/dL — AB (ref >39)
LDL CALC: 109 mg/dL — AB (ref 0–99)
Triglycerides: 101 mg/dL (ref 0–149)
VLDL CHOLESTEROL CAL: 20 mg/dL (ref 5–40)

## 2015-04-24 LAB — CMP14+EGFR
ALT: 8 IU/L (ref 0–44)
AST: 10 IU/L (ref 0–40)
Albumin/Globulin Ratio: 1.6 (ref 1.1–2.5)
Albumin: 3.9 g/dL (ref 3.5–4.8)
Alkaline Phosphatase: 72 IU/L (ref 39–117)
BUN/Creatinine Ratio: 15 (ref 10–22)
BUN: 18 mg/dL (ref 8–27)
Bilirubin Total: 0.5 mg/dL (ref 0.0–1.2)
CO2: 28 mmol/L (ref 18–29)
Calcium: 9.4 mg/dL (ref 8.6–10.2)
Chloride: 94 mmol/L — ABNORMAL LOW (ref 97–106)
Creatinine, Ser: 1.17 mg/dL (ref 0.76–1.27)
GFR calc Af Amer: 70 mL/min/1.73
GFR calc non Af Amer: 61 mL/min/1.73
Globulin, Total: 2.5 g/dL (ref 1.5–4.5)
Glucose: 384 mg/dL — ABNORMAL HIGH (ref 65–99)
Potassium: 4.7 mmol/L (ref 3.5–5.2)
Sodium: 132 mmol/L — ABNORMAL LOW (ref 136–144)
Total Protein: 6.4 g/dL (ref 6.0–8.5)

## 2015-04-26 ENCOUNTER — Ambulatory Visit (INDEPENDENT_AMBULATORY_CARE_PROVIDER_SITE_OTHER): Payer: Medicare HMO | Admitting: Family Medicine

## 2015-04-26 ENCOUNTER — Encounter: Payer: Self-pay | Admitting: Family Medicine

## 2015-04-26 VITALS — BP 113/50 | HR 65 | Temp 97.4°F | Ht 72.0 in | Wt 246.6 lb

## 2015-04-26 DIAGNOSIS — E1165 Type 2 diabetes mellitus with hyperglycemia: Secondary | ICD-10-CM | POA: Diagnosis not present

## 2015-04-26 DIAGNOSIS — I25118 Atherosclerotic heart disease of native coronary artery with other forms of angina pectoris: Secondary | ICD-10-CM | POA: Diagnosis not present

## 2015-04-26 DIAGNOSIS — IMO0001 Reserved for inherently not codable concepts without codable children: Secondary | ICD-10-CM

## 2015-04-26 DIAGNOSIS — Z794 Long term (current) use of insulin: Secondary | ICD-10-CM | POA: Diagnosis not present

## 2015-04-26 DIAGNOSIS — I10 Essential (primary) hypertension: Secondary | ICD-10-CM

## 2015-04-26 DIAGNOSIS — E038 Other specified hypothyroidism: Secondary | ICD-10-CM | POA: Diagnosis not present

## 2015-04-26 DIAGNOSIS — E1142 Type 2 diabetes mellitus with diabetic polyneuropathy: Secondary | ICD-10-CM | POA: Diagnosis not present

## 2015-04-26 DIAGNOSIS — E785 Hyperlipidemia, unspecified: Secondary | ICD-10-CM

## 2015-04-26 DIAGNOSIS — E669 Obesity, unspecified: Secondary | ICD-10-CM

## 2015-04-26 MED ORDER — PRAVASTATIN SODIUM 80 MG PO TABS: 80.0000 mg | ORAL_TABLET | Freq: Every day | ORAL | Status: DC

## 2015-04-26 MED ORDER — NITROGLYCERIN 0.4 MG SL SUBL: 0.4000 mg | SUBLINGUAL_TABLET | SUBLINGUAL | Status: AC | PRN

## 2015-04-26 MED ORDER — TAMSULOSIN HCL 0.4 MG PO CAPS: 0.8000 mg | ORAL_CAPSULE | Freq: Every day | ORAL | Status: DC

## 2015-04-26 MED ORDER — FOSINOPRIL SODIUM 20 MG PO TABS: 20.0000 mg | ORAL_TABLET | Freq: Every day | ORAL | Status: DC

## 2015-04-26 MED ORDER — INSULIN NPH ISOPHANE & REGULAR (70-30) 100 UNIT/ML ~~LOC~~ SUSP: SUBCUTANEOUS | Status: DC

## 2015-04-26 MED ORDER — LEVOTHYROXINE SODIUM 50 MCG PO TABS: 50.0000 ug | ORAL_TABLET | Freq: Every day | ORAL | Status: DC

## 2015-04-26 NOTE — Progress Notes (Signed)
Subjective:  Patient ID: William Sharp, male    DOB: 1940-05-16  Age: 74 y.o. MRN: [id number]  CC: Diabetes; Hypertension; Hyperlipidemia; and Hypothyroidism   HPI William Sharp presents for  follow-up of hypertension. Patient has no history of headache chest pain or shortness of breath or recent cough. Patient also denies symptoms of TIA such as numbness weakness lateralizing. Patient checks  blood pressure at home and has not had any elevated readings recently. Patient denies side effects from his medication. States taking it regularly.  Patient also  in for follow-up of elevated cholesterol. Doing well without complaints on current medication. Denies side effects of statin including myalgia and arthralgia and nausea. Also in today for liver function testing. Currently no chest pain, shortness of breath or other cardiovascular related symptoms noted.  Follow-up of diabetes. Patient does check blood sugar at home. Having trouble getting enough blood for a sample.  Readings run between 300 and 350. Patient admits eating lots of sweets. He says there are a lot of round. Last night someone had a killer cake so he had 2 pieces of that. Patient denies symptoms such as polyuria, polydipsia, excessive hunger, nausea. No significant hypoglycemic spells noted. Medications as noted below. Taking them regularly without complication/adverse reaction being reported today.   DCed diclofenac - pain resolved  History William Sharp has a past medical history of CAD (coronary artery disease); HTN (hypertension), benign; Diabetes mellitus; Hyperlipemia; Hypothyroidism; Obesity; and TIA (transient ischemic attack).    He has past surgical history that includes Coronary artery bypass graft; Tracheostomy; Amputation; and Circumcision (04/29/2014).   His family history includes Cancer in his brother, father, and mother.He reports that he quit smoking about 23 years ago. He does not have any smokeless tobacco history on file.  His alcohol and drug histories are not on file.  Current Outpatient Prescriptions on File Prior to Visit  Medication Sig Dispense Refill  . aspirin EC 325 MG tablet Take 1 tablet (325 mg total) by mouth daily. 30 tablet 0  . metFORMIN (GLUCOPHAGE) 500 MG tablet Take 500 mg by mouth 2 (two) times daily with a meal.    . Vitamin D, Ergocalciferol, (DRISDOL) 50000 UNITS CAPS capsule Take 1 capsule (50,000 Units total) by mouth 2 (two) times a week. 16 capsule 0  . cyclobenzaprine (FLEXERIL) 10 MG tablet Take 0.5 tablets (5 mg total) by mouth at bedtime. (Patient not taking: Reported on 04/26/2015) 30 tablet 1  . diclofenac (VOLTAREN) 75 MG EC tablet Take 1 tablet (75 mg total) by mouth 2 (two) times daily. (Patient not taking: Reported on 04/26/2015) 60 tablet 2  . furosemide (LASIX) 40 MG tablet Take 1 tablet (40 mg total) by mouth daily. (Patient not taking: Reported on 04/26/2015) 30 tablet 3  . gabapentin (NEURONTIN) 300 MG capsule Take 300 mg by mouth at bedtime.     No current facility-administered medications on file prior to visit.    ROS Review of Systems  Constitutional: Negative for fever, chills, diaphoresis and unexpected weight change.  HENT: Negative for congestion, hearing loss, rhinorrhea and sore throat.   Eyes: Negative for visual disturbance.  Respiratory: Negative for cough and shortness of breath.   Cardiovascular: Negative for chest pain.  Gastrointestinal: Negative for abdominal pain, diarrhea and constipation.  Genitourinary: Negative for dysuria and flank pain.  Musculoskeletal: Negative for joint swelling and arthralgias.  Skin: Negative for rash.  Neurological: Positive for weakness (nonfocal) and numbness (feet). Negative for dizziness and headaches.  Psychiatric/Behavioral: Negative for  sleep disturbance and dysphoric mood.    Objective:  BP 113/50 mmHg  Pulse 65  Temp(Src) 97.4 F (36.3 C) (Oral)  Ht 6' (1.829 m)  Wt 246 lb 9.6 oz (111.857 kg)  BMI  33.44 kg/m2  SpO2 97%  BP Readings from Last 3 Encounters:  04/26/15 113/50  02/23/15 124/67  01/21/15 114/56    Wt Readings from Last 3 Encounters:  04/26/15 246 lb 9.6 oz (111.857 kg)  02/23/15 258 lb 6.4 oz (117.209 kg)  01/21/15 246 lb 6.4 oz (111.766 kg)     Physical Exam  Constitutional: He is oriented to person, place, and time. He appears well-developed and well-nourished. No distress.  HENT:  Head: Normocephalic and atraumatic.  Right Ear: External ear normal.  Left Ear: External ear normal.  Nose: Nose normal.  Mouth/Throat: Oropharynx is clear and moist.  Eyes: Conjunctivae and EOM are normal. Pupils are equal, round, and reactive to light.  Neck: Normal range of motion. Neck supple. No thyromegaly present.  Cardiovascular: Normal rate, regular rhythm and normal heart sounds.   No murmur heard. Pulmonary/Chest: Effort normal and breath sounds normal. No respiratory distress. He has no wheezes. He has no rales.  Abdominal: Soft. Bowel sounds are normal. He exhibits no distension. There is no tenderness.  Lymphadenopathy:    He has no cervical adenopathy.  Neurological: He is alert and oriented to person, place, and time. He has normal reflexes.  Skin: Skin is warm and dry.  Psychiatric: He has a normal mood and affect. His behavior is normal. Judgment and thought content normal.    complex DM FOOT EXAM completed. Multiple calluses at heels . ONychomycosis  But well trimmed nails. Complete numbness of both feet on monofilament exam.  Lab Results  Component Value Date   HGBA1C 11.3 04/23/2015   HGBA1C 14.0 01/21/2015   HGBA1C 9.7 09/18/2014    Lab Results  Component Value Date   WBC 4.8 04/23/2015   HGB 12.2* 09/18/2014   HCT 41.0 04/23/2015   GLUCOSE 384* 04/23/2015   CHOL 165 04/23/2015   TRIG 101 04/23/2015   HDL 36* 04/23/2015   LDLCALC 109* 04/23/2015   ALT 8 04/23/2015   AST 10 04/23/2015   NA 132* 04/23/2015   K 4.7 04/23/2015   CL 94*  04/23/2015   CREATININE 1.17 04/23/2015   BUN 18 04/23/2015   CO2 28 04/23/2015   TSH 2.250 09/18/2014   HGBA1C 11.3 04/23/2015    Patient was never admitted.  Assessment & Plan:   William Sharp was seen today for diabetes, hypertension, hyperlipidemia and hypothyroidism.  Diagnoses and all orders for this visit:  Uncontrolled type 2 diabetes mellitus without complication, with long-term current use of insulin (HCC) -     Microalbumin / creatinine urine ratio  Other specified hypothyroidism  Long-term insulin use (HCC)  Hyperlipemia  HTN (hypertension), benign  Coronary artery disease involving native coronary artery of native heart with other form of angina pectoris (HCC)  Obesity  Diabetic polyneuropathy associated with type 2 diabetes mellitus (HCC) -     PR DIABETIC DELUXE SHOE  Other orders -     Discontinue: fosinopril (MONOPRIL) 20 MG tablet; Take 1 tablet (20 mg total) by mouth daily. -     Discontinue: levothyroxine (SYNTHROID, LEVOTHROID) 50 MCG tablet; Take 1 tablet (50 mcg total) by mouth daily. -     Discontinue: pravastatin (PRAVACHOL) 80 MG tablet; Take 1 tablet (80 mg total) by mouth daily. -     nitroGLYCERIN (  NITROSTAT) 0.4 MG SL tablet; Place 1 tablet (0.4 mg total) under the tongue every 5 (five) minutes as needed. -     tamsulosin (FLOMAX) 0.4 MG CAPS capsule; Take 2 capsules (0.8 mg total) by mouth daily after supper. -     fosinopril (MONOPRIL) 20 MG tablet; Take 1 tablet (20 mg total) by mouth daily. -     levothyroxine (SYNTHROID, LEVOTHROID) 50 MCG tablet; Take 1 tablet (50 mcg total) by mouth daily. -     pravastatin (PRAVACHOL) 80 MG tablet; Take 1 tablet (80 mg total) by mouth daily. -     insulin NPH-regular Human (NOVOLIN 70/30) (70-30) 100 UNIT/ML injection; 50 units before breakfast and 40 units before supper   I have changed Mr. Isabelle CourseCaines's nitroGLYCERIN and insulin NPH-regular Human. I am also having him maintain his metFORMIN, gabapentin,  furosemide, Vitamin D (Ergocalciferol), aspirin EC, cyclobenzaprine, diclofenac, tamsulosin, fosinopril, levothyroxine, and pravastatin.  Meds ordered this encounter  Medications  . DISCONTD: fosinopril (MONOPRIL) 20 MG tablet    Sig: Take 1 tablet (20 mg total) by mouth daily.    Dispense:  30 tablet    Refill:  3  . DISCONTD: levothyroxine (SYNTHROID, LEVOTHROID) 50 MCG tablet    Sig: Take 1 tablet (50 mcg total) by mouth daily.    Dispense:  30 tablet    Refill:  5  . DISCONTD: pravastatin (PRAVACHOL) 80 MG tablet    Sig: Take 1 tablet (80 mg total) by mouth daily.    Dispense:  30 tablet    Refill:  5  . nitroGLYCERIN (NITROSTAT) 0.4 MG SL tablet    Sig: Place 1 tablet (0.4 mg total) under the tongue every 5 (five) minutes as needed.    Dispense:  30 tablet    Refill:  11  . tamsulosin (FLOMAX) 0.4 MG CAPS capsule    Sig: Take 2 capsules (0.8 mg total) by mouth daily after supper.    Dispense:  60 capsule    Refill:  3  . fosinopril (MONOPRIL) 20 MG tablet    Sig: Take 1 tablet (20 mg total) by mouth daily.    Dispense:  30 tablet    Refill:  11  . levothyroxine (SYNTHROID, LEVOTHROID) 50 MCG tablet    Sig: Take 1 tablet (50 mcg total) by mouth daily.    Dispense:  30 tablet    Refill:  5  . pravastatin (PRAVACHOL) 80 MG tablet    Sig: Take 1 tablet (80 mg total) by mouth daily.    Dispense:  30 tablet    Refill:  11  . insulin NPH-regular Human (NOVOLIN 70/30) (70-30) 100 UNIT/ML injection    Sig: 50 units before breakfast and 40 units before supper    Dispense:  30 mL    Refill:  11   guidance: patient advised of the risks of eating uncontrolled amounts of simple carbs. Dietary restrictions reviewed. Encouraged to seek a low-carb diet.  Follow-up: Return in about 3 months (around 07/25/2015).  Mechele ClaudeWarren Broxton Broady, M.D.

## 2015-04-27 LAB — MICROALBUMIN / CREATININE URINE RATIO
CREATININE, UR: 47.5 mg/dL
MICROALB/CREAT RATIO: 19.8 mg/g creat (ref 0.0–30.0)
MICROALBUM., U, RANDOM: 9.4 ug/mL

## 2015-05-06 ENCOUNTER — Ambulatory Visit (INDEPENDENT_AMBULATORY_CARE_PROVIDER_SITE_OTHER): Payer: Medicare HMO | Admitting: Family Medicine

## 2015-05-06 ENCOUNTER — Encounter: Payer: Self-pay | Admitting: Family Medicine

## 2015-05-06 VITALS — BP 116/63 | HR 69 | Temp 98.9°F | Ht 72.0 in | Wt 246.0 lb

## 2015-05-06 DIAGNOSIS — L02416 Cutaneous abscess of left lower limb: Secondary | ICD-10-CM | POA: Diagnosis not present

## 2015-05-06 MED ORDER — CEPHALEXIN 500 MG PO CAPS: 500.0000 mg | ORAL_CAPSULE | Freq: Four times a day (QID) | ORAL | Status: DC

## 2015-05-06 NOTE — Progress Notes (Signed)
   Subjective:    Patient ID: William Sharp, male    DOB: 11/24/1940, 74 y.o.   MRN: [id number]  HPI 74 year old gentleman who notes some pain as he was sitting today and felt then hardened tender area that he describes as behind the scrotum. There is been no drainage and symptoms started suddenly.    Review of Systems  Cardiovascular: Negative.   Gastrointestinal: Negative.   Skin: Positive for wound.  Neurological: Negative.   Psychiatric/Behavioral: Negative.       BP 116/63 mmHg  Pulse 69  Temp(Src) 98.9 F (37.2 C) (Oral)  Ht 6' (1.829 m)  Wt 246 lb (111.585 kg)  BMI 33.36 kg/m2  Objective:   Physical Exam  Constitutional: He appears well-developed and well-nourished.  Skin:  There is induration at the posterior left leg where the leg meets the buttock. It really more on his leg it is not soft or fluctuant but indurated and tender and red.          Assessment & Plan:  1. Cutaneous abscess of left lower extremity Will treat this conservatively at this point since there is no fluctuance. Begin Keflex 500 mg 4 times a day. Apply heat 4 times a day. Patient is unable to sit and soak in a warm bath but I have asked him to get a hot water bottle or heating pad and apply to the area. He may return if it starts to drain or softens up and is ready to open up. I have asked him to come back on the 27th for follow-up or as needed.  Frederica KusterStephen M Miller MD

## 2015-05-11 ENCOUNTER — Ambulatory Visit (INDEPENDENT_AMBULATORY_CARE_PROVIDER_SITE_OTHER): Payer: Medicare HMO | Admitting: Family Medicine

## 2015-05-11 ENCOUNTER — Encounter: Payer: Self-pay | Admitting: Family Medicine

## 2015-05-11 VITALS — BP 112/51 | HR 63 | Temp 97.8°F | Ht 72.0 in | Wt 248.8 lb

## 2015-05-11 DIAGNOSIS — L02419 Cutaneous abscess of limb, unspecified: Secondary | ICD-10-CM | POA: Diagnosis not present

## 2015-05-11 DIAGNOSIS — L03119 Cellulitis of unspecified part of limb: Secondary | ICD-10-CM

## 2015-05-11 NOTE — Progress Notes (Signed)
   Subjective:    Patient ID: William MastersJohn Sharp, male    DOB: 23-Sep-1940, 74 y.o.   MRN: [id number]  HPI 74 year old gentleman who was seen last week with abscess or ball on his left upper posterior thigh. He was started on Keflex and asked to do warm compresses. He says that he has been doing that and he presents today for follow-up exam.    Review of Systems  Skin: Positive for wound.        Patient Active Problem List   Diagnosis Date Noted  . Other specified hypothyroidism 01/21/2015  . Long-term insulin use (HCC) 10/19/2014  . CAD (coronary artery disease)   . HTN (hypertension), benign   . Diabetic neuropathy (HCC)   . Hyperlipemia   . Obesity    Outpatient Encounter Prescriptions as of 05/11/2015  Medication Sig  . aspirin EC 325 MG tablet Take 1 tablet (325 mg total) by mouth daily.  . cephALEXin (KEFLEX) 500 MG capsule Take 1 capsule (500 mg total) by mouth 4 (four) times daily.  . diclofenac (VOLTAREN) 75 MG EC tablet Take 1 tablet (75 mg total) by mouth 2 (two) times daily.  . fosinopril (MONOPRIL) 20 MG tablet Take 1 tablet (20 mg total) by mouth daily.  . furosemide (LASIX) 40 MG tablet Take 1 tablet (40 mg total) by mouth daily.  Marland Kitchen. gabapentin (NEURONTIN) 300 MG capsule Take 300 mg by mouth at bedtime.  . insulin NPH-regular Human (NOVOLIN 70/30) (70-30) 100 UNIT/ML injection 50 units before breakfast and 40 units before supper  . levothyroxine (SYNTHROID, LEVOTHROID) 50 MCG tablet Take 1 tablet (50 mcg total) by mouth daily.  . metFORMIN (GLUCOPHAGE) 500 MG tablet Take 500 mg by mouth 2 (two) times daily with a meal.  . nitroGLYCERIN (NITROSTAT) 0.4 MG SL tablet Place 1 tablet (0.4 mg total) under the tongue every 5 (five) minutes as needed.  . pravastatin (PRAVACHOL) 80 MG tablet Take 1 tablet (80 mg total) by mouth daily.  . tamsulosin (FLOMAX) 0.4 MG CAPS capsule Take 2 capsules (0.8 mg total) by mouth daily after supper.  . Vitamin D, Ergocalciferol, (DRISDOL)  50000 UNITS CAPS capsule Take 1 capsule (50,000 Units total) by mouth 2 (two) times a week.  . cyclobenzaprine (FLEXERIL) 10 MG tablet Take 0.5 tablets (5 mg total) by mouth at bedtime. (Patient not taking: Reported on 05/06/2015)   No facility-administered encounter medications on file as of 05/11/2015.      Objective:   Physical Exam  Constitutional: He appears well-developed and well-nourished.  Skin:  Abscess at left upper posterior leg has not changed. There is no fluctuance still feels very hard and indurated and I think not likely to be very productive if I&D is performed.     BP 112/51 mmHg  Pulse 63  Temp(Src) 97.8 F (36.6 C) (Oral)  Ht 6' (1.829 m)  Wt 248 lb 12.8 oz (112.855 kg)  BMI 33.74 kg/m2      Assessment & Plan:   1. Cellulitis and abscess of leg Since abscess is not ready to I&D we have to continue with antibiotic and warm compresses or sitz baths. Patient given instructions that if it does soften up he needs to return for I&D. Also suggested a doughnut to purchase and sit on the take pressure off of that area.  Frederica KusterStephen M Miller MD

## 2015-07-26 ENCOUNTER — Ambulatory Visit: Payer: Medicare HMO | Admitting: Family Medicine

## 2015-07-27 ENCOUNTER — Encounter: Payer: Self-pay | Admitting: Family Medicine

## 2015-07-29 ENCOUNTER — Encounter (HOSPITAL_COMMUNITY): Payer: Self-pay

## 2015-07-29 ENCOUNTER — Emergency Department (HOSPITAL_COMMUNITY)
Admission: EM | Admit: 2015-07-29 | Discharge: 2015-07-29 | Disposition: A | Discharge status: Home / Self Care | Payer: Commercial Managed Care - HMO | Attending: Emergency Medicine | Admitting: Emergency Medicine

## 2015-07-29 DIAGNOSIS — E119 Type 2 diabetes mellitus without complications: Secondary | ICD-10-CM | POA: Diagnosis not present

## 2015-07-29 DIAGNOSIS — Z7982 Long term (current) use of aspirin: Secondary | ICD-10-CM | POA: Insufficient documentation

## 2015-07-29 DIAGNOSIS — E039 Hypothyroidism, unspecified: Secondary | ICD-10-CM | POA: Diagnosis not present

## 2015-07-29 DIAGNOSIS — Z87891 Personal history of nicotine dependence: Secondary | ICD-10-CM | POA: Diagnosis not present

## 2015-07-29 DIAGNOSIS — Z791 Long term (current) use of non-steroidal anti-inflammatories (NSAID): Secondary | ICD-10-CM | POA: Insufficient documentation

## 2015-07-29 DIAGNOSIS — Z7984 Long term (current) use of oral hypoglycemic drugs: Secondary | ICD-10-CM | POA: Diagnosis not present

## 2015-07-29 DIAGNOSIS — H538 Other visual disturbances: Secondary | ICD-10-CM | POA: Diagnosis not present

## 2015-07-29 DIAGNOSIS — E785 Hyperlipidemia, unspecified: Secondary | ICD-10-CM | POA: Insufficient documentation

## 2015-07-29 DIAGNOSIS — Z794 Long term (current) use of insulin: Secondary | ICD-10-CM | POA: Diagnosis not present

## 2015-07-29 DIAGNOSIS — H578 Other specified disorders of eye and adnexa: Secondary | ICD-10-CM | POA: Diagnosis present

## 2015-07-29 DIAGNOSIS — Z792 Long term (current) use of antibiotics: Secondary | ICD-10-CM | POA: Diagnosis not present

## 2015-07-29 DIAGNOSIS — Z951 Presence of aortocoronary bypass graft: Secondary | ICD-10-CM | POA: Diagnosis not present

## 2015-07-29 DIAGNOSIS — E669 Obesity, unspecified: Secondary | ICD-10-CM | POA: Insufficient documentation

## 2015-07-29 DIAGNOSIS — Z79899 Other long term (current) drug therapy: Secondary | ICD-10-CM | POA: Diagnosis not present

## 2015-07-29 DIAGNOSIS — H539 Unspecified visual disturbance: Secondary | ICD-10-CM

## 2015-07-29 DIAGNOSIS — Z8673 Personal history of transient ischemic attack (TIA), and cerebral infarction without residual deficits: Secondary | ICD-10-CM | POA: Insufficient documentation

## 2015-07-29 DIAGNOSIS — I2510 Atherosclerotic heart disease of native coronary artery without angina pectoris: Secondary | ICD-10-CM | POA: Diagnosis not present

## 2015-07-29 LAB — CBG MONITORING, ED: GLUCOSE-CAPILLARY: 176 mg/dL — AB (ref 65–99)

## 2015-07-29 MED ORDER — FLUORESCEIN SODIUM 1 MG OP STRP
1.0000 | ORAL_STRIP | Freq: Once | OPHTHALMIC | Status: AC
Start: 2015-07-29 — End: 2015-07-29
  Administered 2015-07-29: 1 via OPHTHALMIC
  Filled 2015-07-29: qty 1

## 2015-07-29 NOTE — ED Provider Notes (Signed)
CSN: [id number]     Arrival date & time 07/29/15  1304 History  By signing my name below, I, Evon Slack, attest that this documentation has been prepared under the direction and in the presence of Jachob Mcclean, PA-C. Electronically Signed: Evon Slack, ED Scribe. 07/29/2015. 2:08 PM.    Chief Complaint  Patient presents with  . left eye issue    The history is provided by the patient. No language interpreter was used.   HPI Comments: William Sharp is a 75 y.o. male who presents to the Emergency Department complaining of intermittent visual disturbance onset 1 day prior. Pt describes the changes in his vision as blood flowing down the front of his eye. He describes the blood in his eye as seeing black and brown lines and clouds in his vision and that it changes every time he blinks. Pt denies any alleviating or aggravating factors. He also complains of blurred vision in the left eye is unable to complete a visual acuity screening. Pt reports similar symptoms 2 years prior that resolved on its own. He was seen by an ophthalmologist for this but cannot remember the diagnosis. Daughter in the room states that there is no visible blood coming from the eye when he complains of this at home. He denies trauma to the eye, eye pain, eye redness, complete loss of vision, eye discharge, photophobia or redness of the skin around the eye.  Past Medical History  Diagnosis Date  . CAD (coronary artery disease)   . Diabetes mellitus   . Hyperlipemia   . Hypothyroidism   . Obesity   . TIA (transient ischemic attack)    Past Surgical History  Procedure Laterality Date  . Coronary artery bypass graft      2 vessel 2000, [facility]  . Tracheostomy      Age 34  . Amputation      Right second toe/nonhealing ulcer  . Circumcision  04/29/2014   Family History  Problem Relation Age of Onset  . Cancer Father     Lung  . Cancer Mother     Breast  . Cancer Brother     Lung   Social History   Substance Use Topics  . Smoking status: Former Smoker    Quit date: 08/23/1991  . Smokeless tobacco: None  . Alcohol Use: None    Review of Systems  Eyes: Positive for visual disturbance. Negative for photophobia, pain, discharge and redness.  All other systems reviewed and are negative.     Allergies  Review of patient's allergies indicates no known allergies.  Home Medications   Prior to Admission medications   Medication Sig Start Date End Date Taking? Authorizing Provider  aspirin EC 325 MG tablet Take 1 tablet (325 mg total) by mouth daily. 12/23/14   Mechele Claude, MD  cephALEXin (KEFLEX) 500 MG capsule Take 1 capsule (500 mg total) by mouth 4 (four) times daily. 05/06/15   Frederica Kuster, MD  cyclobenzaprine (FLEXERIL) 10 MG tablet Take 0.5 tablets (5 mg total) by mouth at bedtime. Patient not taking: Reported on 05/06/2015 12/23/14   Mechele Claude, MD  diclofenac (VOLTAREN) 75 MG EC tablet Take 1 tablet (75 mg total) by mouth 2 (two) times daily. 12/23/14   Mechele Claude, MD  fosinopril (MONOPRIL) 20 MG tablet Take 1 tablet (20 mg total) by mouth daily. 04/26/15   Mechele Claude, MD  furosemide (LASIX) 40 MG tablet Take 1 tablet (40 mg total) by mouth daily. 09/18/14  Mechele ClaudeWarren Stacks, MD  gabapentin (NEURONTIN) 300 MG capsule Take 300 mg by mouth at bedtime.    Historical Provider, MD  insulin NPH-regular Human (NOVOLIN 70/30) (70-30) 100 UNIT/ML injection 50 units before breakfast and 40 units before supper 04/26/15   Mechele ClaudeWarren Stacks, MD  levothyroxine (SYNTHROID, LEVOTHROID) 50 MCG tablet Take 1 tablet (50 mcg total) by mouth daily. 04/26/15   Mechele ClaudeWarren Stacks, MD  metFORMIN (GLUCOPHAGE) 500 MG tablet Take 500 mg by mouth 2 (two) times daily with a meal.    Historical Provider, MD  nitroGLYCERIN (NITROSTAT) 0.4 MG SL tablet Place 1 tablet (0.4 mg total) under the tongue every 5 (five) minutes as needed. 04/26/15   Mechele ClaudeWarren Stacks, MD  pravastatin (PRAVACHOL) 80 MG tablet Take 1  tablet (80 mg total) by mouth daily. 04/26/15   Mechele ClaudeWarren Stacks, MD  tamsulosin (FLOMAX) 0.4 MG CAPS capsule Take 2 capsules (0.8 mg total) by mouth daily after supper. 04/26/15   Mechele ClaudeWarren Stacks, MD  Vitamin D, Ergocalciferol, (DRISDOL) 50000 UNITS CAPS capsule Take 1 capsule (50,000 Units total) by mouth 2 (two) times a week. 09/21/14   Mechele ClaudeWarren Stacks, MD   BP 112/61 mmHg  Pulse 71  Temp(Src) 98 F (36.7 C) (Oral)  Resp 18  Ht 6' (1.829 m)  Wt 106.595 kg  BMI 31.86 kg/m2  SpO2 97%   Physical Exam  Constitutional: He is oriented to person, place, and time. He appears well-developed and well-nourished. No distress.  HENT:  Head: Normocephalic and atraumatic.  Eyes: Conjunctivae, EOM and lids are normal. Pupils are equal, round, and reactive to light. Right eye exhibits no chemosis and no discharge. Left eye exhibits no chemosis and no discharge. Right conjunctiva is not injected. Right conjunctiva has no hemorrhage. Left conjunctiva is not injected. Left conjunctiva has no hemorrhage.  Fundoscopic exam:      The right eye shows no hemorrhage.       The left eye shows no hemorrhage.  Slit lamp exam:      The right eye shows no corneal abrasion, no corneal ulcer, no foreign body and no hyphema.       The left eye shows no corneal abrasion, no corneal ulcer, no foreign body and no hyphema.  No visible bleeding or discharge noted to the left eye. No conjunctival injection or subconjunctival hemorrhage. Extraocular movements intact without pain. Pupils are equal, round and reactive to light. No hyphema or hypopyon. No corneal abrasions. Significant change in vision in the left eye and unable to complete visual acuity. No erythema of the skin surrounding the eye.    Visual Acuity  Right Eye Distance: 20/80 Left Eye Distance: PT unable to see anything with LT eye . Pt reports everything is blurry Bilateral Distance:     Neck: Neck supple. No tracheal deviation present.  Cardiovascular: Normal  rate.   Pulmonary/Chest: Effort normal. No respiratory distress.  Musculoskeletal: Normal range of motion.  Neurological: He is alert and oriented to person, place, and time.  Skin: Skin is warm and dry.  Psychiatric: He has a normal mood and affect. His behavior is normal.  Nursing note and vitals reviewed.   ED Course  Procedures (including critical care time) DIAGNOSTIC STUDIES: Oxygen Saturation is 97% on RA, normal by my interpretation.    COORDINATION OF CARE: 2:07 PM-Discussed treatment plan with pt at bedside and pt agreed to plan.     Labs Review Labs Reviewed  CBG MONITORING, ED - Abnormal; Notable for the following:  Glucose-Capillary 176 (*)    All other components within normal limits    Imaging Review No results found.    EKG Interpretation None       MDM   Final diagnoses:  Vision changes   75 year old male presenting with visual disturbances. Complains of blood flowing down the front of his eye. No bleeding is visible per daughter. History of the same but cannot remember the diagnosis. Vital signs stable. Patient is nontoxic-appearing. Benign eye exam without evidence of bleeding. Not able to obtain a good ophthalmoscope exam without pupillary dilation. Consulted ophthalmology Dr. Sherryll Burger who scheduled the patient for an appointment immediately. Patient is to leave the emergency department and go to his office as soon as possible. I discussed this with the patient and his daughter who state understanding. Address and phone number for Dr. Sherryll Burger provided and patient is in stable condition upon discharge from the emergency department.  I personally performed the services described in this documentation, which was scribed in my presence. The recorded information has been reviewed and is accurate.      Rolm Gala Robie Mcniel, PA-C 07/29/15 1559  Arby Barrette, MD 07/30/15 [id number]

## 2015-07-29 NOTE — ED Notes (Signed)
Declined W/C at D/C and was escorted to lobby by RN. 

## 2015-07-29 NOTE — Discharge Instructions (Signed)
Go to Dr. Margaretmary EddyShah's office after leaving the ED. You have an appointment there and he is expecting you. Address is [facility]   Visual Disturbances You have had a disturbance in your vision. This may be caused by various conditions, such as:  Migraines. Migraine headaches are often preceded by a disturbance in vision. Blind spots or light flashes are followed by a headache. This type of visual disturbance is temporary. It does not damage the eye.  Glaucoma. This is caused by increased pressure in the eye. Symptoms include haziness, blurred vision, or seeing rainbow colored circles when looking at bright lights. Partial or complete visual loss can occur. You may or may not experience eye pain. Visual loss may be gradual or sudden and is irreversible. Glaucoma is the leading cause of blindness.  Retina problems. Vision will be reduced if the retina becomes detached or if there is a circulation problem as with diabetes, high blood pressure, or a mini-stroke. Symptoms include seeing "floaters," flashes of light, or shadows, as if a curtain has fallen over your eye.  Optic nerve problems. The main nerve in your eye can be damaged by redness, soreness, and swelling (inflammation), poor circulation, drugs, and toxins. It is very important to have a complete exam done by a specialist to determine the exact cause of your eye problem. The specialist may recommend medicines or surgery, depending on the cause of the problem. This can help prevent further loss of vision or reduce the risk of having a stroke. Contact the caregiver to whom you have been referred and arrange for follow-up care right away. SEEK IMMEDIATE MEDICAL CARE IF:   Your vision gets worse.  You develop severe headaches.  You have any weakness or numbness in the face, arms, or legs.  You have any trouble speaking or walking.   This information is not intended to replace advice given to you by your health care provider. Make  sure you discuss any questions you have with your health care provider.   Document Released: 06/08/2004 Document Revised: 07/24/2011 Document Reviewed: 10/08/2013 Elsevier Interactive Patient Education [employer] Elsevier Inc.

## 2015-07-29 NOTE — ED Notes (Signed)
Patient reports that he awoke with some bleeding from left eye, denies trauma and no bleeding noted on assesment

## 2015-07-29 NOTE — ED Notes (Signed)
PT with daughter and will go to [facility] after DC.

## 2015-10-26 ENCOUNTER — Other Ambulatory Visit (HOSPITAL_COMMUNITY): Payer: Self-pay | Admitting: *Deleted

## 2015-10-26 DIAGNOSIS — R413 Other amnesia: Secondary | ICD-10-CM

## 2015-10-26 DIAGNOSIS — M25552 Pain in left hip: Secondary | ICD-10-CM

## 2015-10-26 DIAGNOSIS — W101XXS Fall (on)(from) sidewalk curb, sequela: Secondary | ICD-10-CM

## 2015-10-29 ENCOUNTER — Ambulatory Visit (HOSPITAL_COMMUNITY)
Admission: RE | Admit: 2015-10-29 | Discharge: 2015-10-29 | Disposition: A | Discharge status: Home / Self Care | Payer: Commercial Managed Care - HMO | Source: Ambulatory Visit | Attending: *Deleted | Admitting: *Deleted

## 2015-10-29 DIAGNOSIS — R220 Localized swelling, mass and lump, head: Secondary | ICD-10-CM | POA: Insufficient documentation

## 2015-10-29 DIAGNOSIS — R413 Other amnesia: Secondary | ICD-10-CM

## 2015-10-29 DIAGNOSIS — M25552 Pain in left hip: Secondary | ICD-10-CM | POA: Diagnosis not present

## 2015-10-29 DIAGNOSIS — I708 Atherosclerosis of other arteries: Secondary | ICD-10-CM | POA: Diagnosis not present

## 2015-10-29 DIAGNOSIS — J328 Other chronic sinusitis: Secondary | ICD-10-CM | POA: Diagnosis not present

## 2015-10-29 DIAGNOSIS — W101XXS Fall (on)(from) sidewalk curb, sequela: Secondary | ICD-10-CM | POA: Insufficient documentation

## 2015-10-29 DIAGNOSIS — I70209 Unspecified atherosclerosis of native arteries of extremities, unspecified extremity: Secondary | ICD-10-CM | POA: Insufficient documentation

## 2017-12-27 ENCOUNTER — Other Ambulatory Visit: Payer: Self-pay

## 2017-12-27 ENCOUNTER — Encounter (HOSPITAL_COMMUNITY): Payer: Self-pay | Admitting: Emergency Medicine

## 2017-12-27 ENCOUNTER — Emergency Department (HOSPITAL_COMMUNITY)
Admission: EM | Admit: 2017-12-27 | Discharge: 2017-12-27 | Disposition: A | Discharge status: Home / Self Care | Payer: Medicare HMO | Attending: Emergency Medicine | Admitting: Emergency Medicine

## 2017-12-27 DIAGNOSIS — E1165 Type 2 diabetes mellitus with hyperglycemia: Secondary | ICD-10-CM | POA: Insufficient documentation

## 2017-12-27 DIAGNOSIS — Z87891 Personal history of nicotine dependence: Secondary | ICD-10-CM | POA: Diagnosis not present

## 2017-12-27 DIAGNOSIS — Z89422 Acquired absence of other left toe(s): Secondary | ICD-10-CM | POA: Insufficient documentation

## 2017-12-27 DIAGNOSIS — Z951 Presence of aortocoronary bypass graft: Secondary | ICD-10-CM | POA: Insufficient documentation

## 2017-12-27 DIAGNOSIS — E039 Hypothyroidism, unspecified: Secondary | ICD-10-CM | POA: Diagnosis not present

## 2017-12-27 DIAGNOSIS — Z794 Long term (current) use of insulin: Secondary | ICD-10-CM | POA: Diagnosis not present

## 2017-12-27 DIAGNOSIS — R531 Weakness: Secondary | ICD-10-CM | POA: Diagnosis present

## 2017-12-27 DIAGNOSIS — Z8673 Personal history of transient ischemic attack (TIA), and cerebral infarction without residual deficits: Secondary | ICD-10-CM | POA: Insufficient documentation

## 2017-12-27 DIAGNOSIS — R739 Hyperglycemia, unspecified: Secondary | ICD-10-CM

## 2017-12-27 LAB — COMPREHENSIVE METABOLIC PANEL
ALT: 13 U/L (ref 0–44)
ANION GAP: 10 (ref 5–15)
AST: 18 U/L (ref 15–41)
Albumin: 4.1 g/dL (ref 3.5–5.0)
Alkaline Phosphatase: 63 U/L (ref 38–126)
BILIRUBIN TOTAL: 1 mg/dL (ref 0.3–1.2)
BUN: 20 mg/dL (ref 8–23)
CHLORIDE: 97 mmol/L — AB (ref 98–111)
CO2: 24 mmol/L (ref 22–32)
Calcium: 9.1 mg/dL (ref 8.9–10.3)
Creatinine, Ser: 1.19 mg/dL (ref 0.61–1.24)
GFR calc Af Amer: >60 mL/min (ref >60)
GFR calc non Af Amer: 57 mL/min — ABNORMAL LOW (ref >60)
GLUCOSE: 495 mg/dL — AB (ref 70–99)
POTASSIUM: 4 mmol/L (ref 3.5–5.1)
SODIUM: 131 mmol/L — AB (ref 135–145)
TOTAL PROTEIN: 7.2 g/dL (ref 6.5–8.1)

## 2017-12-27 LAB — URINALYSIS, ROUTINE W REFLEX MICROSCOPIC
BACTERIA UA: NONE SEEN
Bilirubin Urine: NEGATIVE
Glucose, UA: >=500 mg/dL — AB
Hgb urine dipstick: NEGATIVE
KETONES UR: 5 mg/dL — AB
Leukocytes, UA: NEGATIVE
NITRITE: NEGATIVE
PH: 6 (ref 5.0–8.0)
PROTEIN: NEGATIVE mg/dL
Specific Gravity, Urine: 1.024 (ref 1.005–1.030)

## 2017-12-27 LAB — CBC WITH DIFFERENTIAL/PLATELET
Basophils Absolute: 0 10*3/uL (ref 0.0–0.1)
Basophils Relative: 1 %
Eosinophils Absolute: 0.1 10*3/uL (ref 0.0–0.7)
Eosinophils Relative: 2 %
HEMATOCRIT: 45.3 % (ref 39.0–52.0)
Hemoglobin: 15.4 g/dL (ref 13.0–17.0)
LYMPHS ABS: 1.8 10*3/uL (ref 0.7–4.0)
LYMPHS PCT: 31 %
MCH: 30.5 pg (ref 26.0–34.0)
MCHC: 34 g/dL (ref 30.0–36.0)
MCV: 89.7 fL (ref 78.0–100.0)
MONO ABS: 0.4 10*3/uL (ref 0.1–1.0)
Monocytes Relative: 7 %
NEUTROS ABS: 3.5 10*3/uL (ref 1.7–7.7)
Neutrophils Relative %: 59 %
Platelets: 229 10*3/uL (ref 150–400)
RBC: 5.05 MIL/uL (ref 4.22–5.81)
RDW: 13.6 % (ref 11.5–15.5)
WBC: 5.8 10*3/uL (ref 4.0–10.5)

## 2017-12-27 LAB — I-STAT TROPONIN, ED: Troponin i, poc: 0.01 ng/mL (ref 0.00–0.08)

## 2017-12-27 LAB — CBG MONITORING, ED: Glucose-Capillary: 281 mg/dL — ABNORMAL HIGH (ref 70–99)

## 2017-12-27 MED ORDER — INSULIN ASPART 100 UNIT/ML ~~LOC~~ SOLN
10.0000 [IU] | Freq: Once | SUBCUTANEOUS | Status: AC
  Administered 2017-12-27: 10 [IU] via INTRAVENOUS
  Filled 2017-12-27: qty 1

## 2017-12-27 NOTE — Discharge Instructions (Signed)

## 2017-12-27 NOTE — ED Provider Notes (Signed)
Emergency Department Provider Note   I have reviewed the triage vital signs and the nursing notes.   HISTORY  Chief Complaint Weakness   HPI Josmar Messimer is a 77 y.o. male with PMH of CAD, IDDM, HLD, and TIA emergency department for evaluation of generalized weakness in the setting of hyperglycemia.  The patient states that he has not taken any insulin for the past 2 weeks because his refrigerator has been freezing medication.  He is tried to adjust the temperature but states that is not working.  He states he is been feeling okay and continuing to check his blood sugars which have been running higher than normal until yesterday when he began feeling generally weak. Denies any CP, SOB, or unilateral weakness/numbness.  He has not made any other changes to his medications.  No nausea, vomiting, diarrhea.  No abdominal pain.    Past Medical History:  Diagnosis Date  . CAD (coronary artery disease)   . Diabetes mellitus   . Hyperlipemia   . Hypothyroidism   . Obesity   . TIA (transient ischemic attack)     Patient Active Problem List   Diagnosis Date Noted  . Cellulitis and abscess of leg 05/11/2015  . Other specified hypothyroidism 01/21/2015  . Brandin Dilday-term insulin use (HCC) 10/19/2014  . CAD (coronary artery disease)   . HTN (hypertension), benign   . Diabetic neuropathy (HCC)   . Hyperlipemia   . Obesity     Past Surgical History:  Procedure Laterality Date  . AMPUTATION     Right second toe/nonhealing ulcer  . CIRCUMCISION  04/29/2014  . CORONARY ARTERY BYPASS GRAFT     2 vessel 2000, [facility]  . TRACHEOSTOMY     Age 54    Allergies Patient has no known allergies.  Family History  Problem Relation Age of Onset  . Cancer Father        Lung  . Cancer Mother        Breast  . Cancer Brother        Lung    Social History Social History   Tobacco Use  . Smoking status: Former Smoker    Last attempt to quit: 08/23/1991    Years since quitting: 26.3  .  Smokeless tobacco: Never Used  Substance Use Topics  . Alcohol use: Never    Frequency: Never  . Drug use: Never    Review of Systems  Constitutional: No fever/chills. Positive generalized weakness and elevated CBG.  Eyes: No visual changes. ENT: No sore throat. Cardiovascular: Denies chest pain. Respiratory: Denies shortness of breath. Gastrointestinal: No abdominal pain.  No nausea, no vomiting.  No diarrhea.  No constipation. Genitourinary: Negative for dysuria. Musculoskeletal: Negative for back pain. Skin: Negative for rash. Neurological: Negative for headaches, focal weakness or numbness.  10-point ROS otherwise negative.  ____________________________________________   PHYSICAL EXAM:  VITAL SIGNS: ED Triage Vitals  Enc Vitals Group     BP 12/27/17 1344 (!) 135/53     Pulse Rate 12/27/17 1344 72     Resp 12/27/17 1344 18     Temp 12/27/17 1344 98.7 F (37.1 C)     Temp Source 12/27/17 1344 Oral     SpO2 12/27/17 1344 100 %     Weight 12/27/17 1346 219 lb (99.3 kg)     Height 12/27/17 1346 6' (1.829 m)     Pain Score 12/27/17 1350 0   Constitutional: Alert and oriented. Well appearing and in no acute distress.  Eyes: Conjunctivae are normal.  Head: Atraumatic. Nose: No congestion/rhinnorhea. Mouth/Throat: Mucous membranes are slightly dry.  Neck: No stridor.  Cardiovascular: Normal rate, regular rhythm. Good peripheral circulation. Grossly normal heart sounds.   Respiratory: Normal respiratory effort.  No retractions. Lungs CTAB. Gastrointestinal: Soft and nontender. No distention.  Musculoskeletal: No lower extremity tenderness nor edema. No gross deformities of extremities. Neurologic:  Normal speech and language. No gross focal neurologic deficits are appreciated.  Skin:  Skin is warm, dry and intact. No rash noted.  ____________________________________________   LABS (all labs ordered are listed, but only abnormal results are displayed)  Labs Reviewed   COMPREHENSIVE METABOLIC PANEL - Abnormal; Notable for the following components:      Result Value   Sodium 131 (*)    Chloride 97 (*)    Glucose, Bld 495 (*)    GFR calc non Af Amer 57 (*)    All other components within normal limits  URINALYSIS, ROUTINE W REFLEX MICROSCOPIC - Abnormal; Notable for the following components:   Color, Urine STRAW (*)    Glucose, UA >=500 (*)    Ketones, ur 5 (*)    All other components within normal limits  CBG MONITORING, ED - Abnormal; Notable for the following components:   Glucose-Capillary 281 (*)    All other components within normal limits  CBC WITH DIFFERENTIAL/PLATELET  I-STAT TROPONIN, ED   ____________________________________________  EKG   EKG Interpretation  Date/Time:  Thursday December 27 2017 14:05:52 EDT Ventricular Rate:  70 PR Interval:  152 QRS Duration: 130 QT Interval:  416 QTC Calculation: 449 R Axis:   74 Text Interpretation:  Normal sinus rhythm Non-specific intra-ventricular conduction block Inferior infarct , age undetermined Cannot rule out Anterior infarct , age undetermined T wave abnormality, consider lateral ischemia Abnormal ECG No STEMI.  Confirmed by Alona BeneLong, Kasir Hallenbeck [phone]([id number]) on 12/27/2017 3:11:41 PM       ____________________________________________  RADIOLOGY  None ____________________________________________   PROCEDURES  Procedure(s) performed:   Procedures  None ____________________________________________   INITIAL IMPRESSION / ASSESSMENT AND PLAN / ED COURSE  Pertinent labs & imaging results that were available during my care of the patient were reviewed by me and considered in my medical decision making (see chart for details).  Patient presents to the emergency department with elevated blood sugar in the setting of insulin noncompliance for the past 2 weeks.  His insulin supply has been freezing in his refrigerator.  CBG with EMS is 560.  He is not exhibiting kussmaul respirations. No  abdominal pain or tenderness. No focal neurological deficits. Plan for screening labs to assess for DKA and reassess. IVF started by EMS.   Hyperglycemia without DKA. Patient wife at bedside. They plan to buy a mini-fridge to store his insulin from now on to better control temp. Will discuss ED visit with PCP. CBG down-trending with insulin.   At this time, I do not feel there is any life-threatening condition present. I have reviewed and discussed all results (EKG, imaging, lab, urine as appropriate), exam findings with patient. I have reviewed nursing notes and appropriate previous records.  I feel the patient is safe to be discharged home without further emergent workup. Discussed usual and customary return precautions. Patient and family (if present) verbalize understanding and are comfortable with this plan.  Patient will follow-up with their primary care provider. If they do not have a primary care provider, information for follow-up has been provided to them. All questions have been answered.  ____________________________________________  FINAL CLINICAL IMPRESSION(S) / ED DIAGNOSES  Final diagnoses:  Hyperglycemia     MEDICATIONS GIVEN DURING THIS VISIT:  Medications  insulin aspart (novoLOG) injection 10 Units (10 Units Intravenous Given 12/27/17 1557)    Note:  This document was prepared using Dragon voice recognition software and may include unintentional dictation errors.  Alona BeneJoshua Amra Shukla, MD Emergency Medicine\    Cesario Weidinger, Arlyss RepressJoshua G, MD 12/27/17 [id number]

## 2017-12-27 NOTE — ED Triage Notes (Signed)
PT brought in by [facility] for c/o generalized weakness starting yesterday. CBG 560 today and pt reports he has not used any insulin at home for the past 2 weeks. [facility] and has ate several sweets before EMS arrived. PT denies any pain and A & O upon arrival.

## 2018-01-03 ENCOUNTER — Encounter (HOSPITAL_COMMUNITY): Payer: Self-pay

## 2018-01-03 ENCOUNTER — Other Ambulatory Visit: Payer: Self-pay

## 2018-01-03 ENCOUNTER — Emergency Department (HOSPITAL_COMMUNITY)
Admission: EM | Admit: 2018-01-03 | Discharge: 2018-01-03 | Disposition: A | Discharge status: Home / Self Care | Payer: Medicare HMO | Attending: Emergency Medicine | Admitting: Emergency Medicine

## 2018-01-03 ENCOUNTER — Emergency Department (HOSPITAL_COMMUNITY): Payer: Medicare HMO

## 2018-01-03 DIAGNOSIS — R42 Dizziness and giddiness: Secondary | ICD-10-CM | POA: Diagnosis not present

## 2018-01-03 DIAGNOSIS — R410 Disorientation, unspecified: Secondary | ICD-10-CM | POA: Diagnosis not present

## 2018-01-03 DIAGNOSIS — Z794 Long term (current) use of insulin: Secondary | ICD-10-CM | POA: Insufficient documentation

## 2018-01-03 DIAGNOSIS — Z87891 Personal history of nicotine dependence: Secondary | ICD-10-CM | POA: Diagnosis not present

## 2018-01-03 DIAGNOSIS — E1165 Type 2 diabetes mellitus with hyperglycemia: Secondary | ICD-10-CM | POA: Insufficient documentation

## 2018-01-03 DIAGNOSIS — E039 Hypothyroidism, unspecified: Secondary | ICD-10-CM | POA: Insufficient documentation

## 2018-01-03 DIAGNOSIS — Z951 Presence of aortocoronary bypass graft: Secondary | ICD-10-CM | POA: Insufficient documentation

## 2018-01-03 DIAGNOSIS — I2510 Atherosclerotic heart disease of native coronary artery without angina pectoris: Secondary | ICD-10-CM | POA: Insufficient documentation

## 2018-01-03 DIAGNOSIS — R4182 Altered mental status, unspecified: Secondary | ICD-10-CM | POA: Diagnosis present

## 2018-01-03 DIAGNOSIS — Z8673 Personal history of transient ischemic attack (TIA), and cerebral infarction without residual deficits: Secondary | ICD-10-CM | POA: Insufficient documentation

## 2018-01-03 DIAGNOSIS — R4781 Slurred speech: Secondary | ICD-10-CM | POA: Diagnosis not present

## 2018-01-03 LAB — CBC WITH DIFFERENTIAL/PLATELET
BASOS ABS: 0.1 10*3/uL (ref 0.0–0.1)
Basophils Relative: 1 %
EOS PCT: 2 %
Eosinophils Absolute: 0.1 10*3/uL (ref 0.0–0.7)
HEMATOCRIT: 41.8 % (ref 39.0–52.0)
Hemoglobin: 13.6 g/dL (ref 13.0–17.0)
LYMPHS PCT: 24 %
Lymphs Abs: 1.5 10*3/uL (ref 0.7–4.0)
MCH: 30 pg (ref 26.0–34.0)
MCHC: 32.5 g/dL (ref 30.0–36.0)
MCV: 92.3 fL (ref 78.0–100.0)
MONOS PCT: 8 %
Monocytes Absolute: 0.5 10*3/uL (ref 0.1–1.0)
Neutro Abs: 3.9 10*3/uL (ref 1.7–7.7)
Neutrophils Relative %: 65 %
PLATELETS: 256 10*3/uL (ref 150–400)
RBC: 4.53 MIL/uL (ref 4.22–5.81)
RDW: 14 % (ref 11.5–15.5)
WBC: 6 10*3/uL (ref 4.0–10.5)

## 2018-01-03 LAB — URINALYSIS, COMPLETE (UACMP) WITH MICROSCOPIC
BACTERIA UA: NONE SEEN
Bilirubin Urine: NEGATIVE
GLUCOSE, UA: 150 mg/dL — AB
Hgb urine dipstick: NEGATIVE
Ketones, ur: NEGATIVE mg/dL
LEUKOCYTES UA: NEGATIVE
Nitrite: NEGATIVE
PH: 5 (ref 5.0–8.0)
Protein, ur: NEGATIVE mg/dL
RBC / HPF: NONE SEEN RBC/hpf (ref 0–5)
SPECIFIC GRAVITY, URINE: 1.011 (ref 1.005–1.030)
Squamous Epithelial / LPF: NONE SEEN (ref 0–5)
WBC, UA: NONE SEEN WBC/hpf (ref 0–5)

## 2018-01-03 LAB — AMMONIA: Ammonia: 16 umol/L (ref 9–35)

## 2018-01-03 LAB — CBG MONITORING, ED
GLUCOSE-CAPILLARY: 119 mg/dL — AB (ref 70–99)
Glucose-Capillary: 225 mg/dL — ABNORMAL HIGH (ref 70–99)
Glucose-Capillary: 77 mg/dL (ref 70–99)

## 2018-01-03 LAB — COMPREHENSIVE METABOLIC PANEL
ALBUMIN: 3.7 g/dL (ref 3.5–5.0)
ALK PHOS: 56 U/L (ref 38–126)
ALT: 10 U/L (ref 0–44)
AST: 17 U/L (ref 15–41)
Anion gap: 9 (ref 5–15)
BILIRUBIN TOTAL: 0.5 mg/dL (ref 0.3–1.2)
BUN: 22 mg/dL (ref 8–23)
CO2: 27 mmol/L (ref 22–32)
Calcium: 9.2 mg/dL (ref 8.9–10.3)
Chloride: 100 mmol/L (ref 98–111)
Creatinine, Ser: 0.9 mg/dL (ref 0.61–1.24)
GFR calc Af Amer: >60 mL/min (ref >60)
GFR calc non Af Amer: >60 mL/min (ref >60)
GLUCOSE: 207 mg/dL — AB (ref 70–99)
POTASSIUM: 4.1 mmol/L (ref 3.5–5.1)
SODIUM: 136 mmol/L (ref 135–145)
TOTAL PROTEIN: 6.7 g/dL (ref 6.5–8.1)

## 2018-01-03 LAB — TROPONIN I
TROPONIN I: 0.07 ng/mL — AB (ref <0.03)
Troponin I: 0.06 ng/mL (ref <0.03)

## 2018-01-03 MED ORDER — SODIUM CHLORIDE 0.9 % IV SOLN
INTRAVENOUS | Status: DC
  Administered 2018-01-03: 11:00:00 via INTRAVENOUS

## 2018-01-03 NOTE — ED Notes (Signed)
Date and time results received: 01/03/18 1613 (use smartphrase ".now" to insert current time)  Test: Troponin Critical Value: 0.06  Name of Provider Notified: Tammie PA  Orders Received? Or Actions Taken?: Orders Received - See Orders for details

## 2018-01-03 NOTE — ED Provider Notes (Signed)
  Face-to-face evaluation   History: She is here for evaluation of intermittent episodes of altered mental status.  They typically are worse at nighttime.  Denies chest pain, shortness of breath, focal weakness or paresthesia.  Physical exam: Alert elderly male.  Calm, comfortable, lucid.  Heart regular rate and rhythm without murmur, lungs clear to auscultation.   Medical screening examination/treatment/procedure(s) were conducted as a shared visit with non-physician practitioner(s) and myself.  I personally evaluated the patient during the encounter   EKG Interpretation  Date/Time:  Thursday January 03 2018 10:05:05 EDT Ventricular Rate:  79 PR Interval:    QRS Duration: 118 QT Interval:  375 QTC Calculation: 430 R Axis:   84 Text Interpretation:  Sinus rhythm Prolonged PR interval Nonspecific intraventricular conduction delay Inferolateral infarct, age indeterminate since last tracing no significant change Confirmed by Mancel BaleWentz, Suhaan Perleberg [phone]) on 01/03/2018 10:31:20 AM         Mancel BaleWentz, Graysyn Bache, MD 01/05/18 1100

## 2018-01-03 NOTE — ED Notes (Signed)
Date and time results received: 01/03/18 12:20 PM  (use smartphrase ".now" to insert current time)  Test: Troponin Critical Value: 0.07  Name of Provider Notified: Tammie PA  Orders Received? Or Actions Taken?: Orders Received - See Orders for details

## 2018-01-03 NOTE — ED Triage Notes (Signed)
Pt says was sent by Dr. Charm BargesButler for evaluation today.  Pt and spouse say that pt has had several episodes of "altered mental status" for the past week.  Reports episodes last for hours.  Reports last episode occurred last night and lasted for approx 1 hour.  Pt here recently for blood sugar problems.  Pt presently alert and oriented.  Denies pain.  Reports dizziness, sob, and generalized weakness.

## 2018-01-03 NOTE — ED Notes (Signed)
Patient transported to CT 

## 2018-01-03 NOTE — Discharge Instructions (Addendum)
As discussed, decrease your evening insulin dose to 30 units.  It is important that you check your blood sugar daily.  Call Dr. Silvana NewnessButler's office to arrange a follow-up appointment.  Return to the ER for any worsening symptoms such as chest pain, shortness of breath, numbness or weakness.

## 2018-01-04 NOTE — ED Provider Notes (Signed)
[facility] EMERGENCY DEPARTMENT Provider Note   CSN: [id number] Arrival date & time: 01/03/18  0935     History   Chief Complaint Chief Complaint  Patient presents with  . Altered Mental Status    HPI William Sharp is a 77 y.o. male.  HPI   William Sharp is a 77 y.o. male who presents to the Emergency Department complaining of intermittent episodes of altered mental status for 1 week.  Patient and his spouse state the symptoms mostly occur in the evening.  Patient's spouse states that he becomes confused and begins slurring his words and speaking "gibberish".  She states the symptoms will last for more than 1 hour.  Patient states he has been taking metformin and insulin and takes 40 units at night and 50 units in the mornings.  He also states that he was recently seen here for elevated blood sugars and states he does not currently have test strips to check his blood sugars at home.  He also states that he fell while trying to get in his car and struck the back of his head.  This occurred 1 week ago.  He is concerned his symptoms possibly be related to this.  He also states that he has some dizziness with standing or sudden position change.  He denies chest pain, shortness of breath, abdominal pain, vomiting, fever, chills, visual changes and headache.  Past Medical History:  Diagnosis Date  . CAD (coronary artery disease)   . Diabetes mellitus   . Hyperlipemia   . Hypothyroidism   . Obesity   . TIA (transient ischemic attack)     Patient Active Problem List   Diagnosis Date Noted  . Cellulitis and abscess of leg 05/11/2015  . Other specified hypothyroidism 01/21/2015  . Long-term insulin use (HCC) 10/19/2014  . CAD (coronary artery disease)   . HTN (hypertension), benign   . Diabetic neuropathy (HCC)   . Hyperlipemia   . Obesity     Past Surgical History:  Procedure Laterality Date  . AMPUTATION     Right second toe/nonhealing ulcer  . CIRCUMCISION  04/29/2014  .  CORONARY ARTERY BYPASS GRAFT     2 vessel 2000, [facility]  . TRACHEOSTOMY     Age 56        Home Medications    Prior to Admission medications   Medication Sig Start Date End Date Taking? Authorizing Provider  insulin NPH-regular Human (NOVOLIN 70/30) (70-30) 100 UNIT/ML injection 50 units before breakfast and 40 units before supper 04/26/15  Yes Stacks, Broadus JohnWarren, MD  metFORMIN (GLUCOPHAGE) 500 MG tablet Take 500 mg by mouth 2 (two) times daily with a meal.   Yes [provider]  nitroGLYCERIN (NITROSTAT) 0.4 MG SL tablet Place 1 tablet (0.4 mg total) under the tongue every 5 (five) minutes as needed. 04/26/15  Yes Mechele ClaudeStacks, Warren, MD    Family History Family History  Problem Relation Age of Onset  . Cancer Father        Lung  . Cancer Mother        Breast  . Cancer Brother        Lung    Social History Social History   Tobacco Use  . Smoking status: Former Smoker    Last attempt to quit: 08/23/1991    Years since quitting: 26.3  . Smokeless tobacco: Never Used  Substance Use Topics  . Alcohol use: Never    Frequency: Never  . Drug use: Never  Allergies   Patient has no known allergies.   Review of Systems Review of Systems  Constitutional: Negative for activity change, appetite change and fever.  HENT: Negative for facial swelling, sore throat and trouble swallowing.   Eyes: Negative for photophobia, pain and visual disturbance.  Respiratory: Negative for chest tightness and shortness of breath.   Cardiovascular: Negative for chest pain and leg swelling.  Gastrointestinal: Negative for abdominal pain, nausea and vomiting.  Genitourinary: Negative for decreased urine volume, difficulty urinating and dysuria.  Musculoskeletal: Negative for neck pain and neck stiffness.  Skin: Negative for rash and wound.  Neurological: Positive for dizziness, speech difficulty and weakness. Negative for facial asymmetry, numbness and headaches.    Psychiatric/Behavioral: Positive for confusion. Negative for decreased concentration. The patient is not nervous/anxious.      Physical Exam Updated Vital Signs BP 117/87   Pulse 77   Temp 98.4 F (36.9 C) (Oral)   Resp 18   Ht 6' (1.829 m)   Wt 99.3 kg   SpO2 98%   BMI 29.69 kg/m   Physical Exam  Constitutional: He is oriented to person, place, and time. He appears well-developed and well-nourished. No distress.  HENT:  Head: Normocephalic.  Mouth/Throat: Oropharynx is clear and moist.  Eyes: Pupils are equal, round, and reactive to light. Conjunctivae and EOM are normal.  Neck: Normal range of motion and phonation normal. No JVD present.  Cardiovascular: Normal rate, regular rhythm and intact distal pulses.  Pulmonary/Chest: Effort normal and breath sounds normal. No respiratory distress. He exhibits no tenderness.  Abdominal: Soft. He exhibits no distension and no mass. There is no tenderness. There is no guarding.  Musculoskeletal: Normal range of motion. He exhibits no edema.  Neurological: He is alert and oriented to person, place, and time. He has normal strength. No sensory deficit. GCS eye subscore is 4. GCS verbal subscore is 5. GCS motor subscore is 6.  CN II-XII intact, answers questions appropriately, speech is clear, no pronator drift, normal finger-nose testing.  Skin: Skin is warm. Capillary refill takes less than 2 seconds.  Psychiatric: He has a normal mood and affect.  Nursing note and vitals reviewed.    ED Treatments / Results  Labs (all labs ordered are listed, but only abnormal results are displayed) Labs Reviewed  COMPREHENSIVE METABOLIC PANEL - Abnormal; Notable for the following components:      Result Value   Glucose, Bld 207 (*)    All other components within normal limits  URINALYSIS, COMPLETE (UACMP) WITH MICROSCOPIC - Abnormal; Notable for the following components:   Color, Urine STRAW (*)    Glucose, UA 150 (*)    All other components  within normal limits  TROPONIN I - Abnormal; Notable for the following components:   Troponin I 0.07 (*)    All other components within normal limits  TROPONIN I - Abnormal; Notable for the following components:   Troponin I 0.06 (*)    All other components within normal limits  CBG MONITORING, ED - Abnormal; Notable for the following components:   Glucose-Capillary 225 (*)    All other components within normal limits  CBG MONITORING, ED - Abnormal; Notable for the following components:   Glucose-Capillary 119 (*)    All other components within normal limits  CBC WITH DIFFERENTIAL/PLATELET  AMMONIA  CBG MONITORING, ED    EKG EKG Interpretation  Date/Time:  Thursday January 03 2018 10:05:05 EDT Ventricular Rate:  79 PR Interval:    QRS Duration:  118 QT Interval:  375 QTC Calculation: 430 R Axis:   84 Text Interpretation:  Sinus rhythm Prolonged PR interval Nonspecific intraventricular conduction delay Inferolateral infarct, age indeterminate since last tracing no significant change Confirmed by Mancel Bale [phone]) on 01/03/2018 10:31:20 AM   Radiology Dg Chest 2 View  Result Date: 01/03/2018 CLINICAL DATA:  77 year old male with episodes of altered mental status this week. Dizziness, shortness of breath and weakness. EXAM: CHEST - 2 VIEW COMPARISON:  None. FINDINGS: Prior sternotomy and CABG with multiple fractured lower sternotomy wires. Aside from borderline to mild cardiomegaly mediastinal contours remain within normal limits. Visualized tracheal air column is within normal limits. Calcified aortic atherosclerosis. Normal lung volumes. The lungs are clear the; possible tiny left upper lobe calcified granulomas. No pneumothorax, pulmonary edema or pleural effusion. No acute osseous abnormality identified. Negative visible bowel gas pattern. IMPRESSION: 1.  No acute cardiopulmonary abnormality. 2.  Aortic Atherosclerosis (ICD10-I70.0). Electronically Signed   By: Odessa Fleming M.D.   On:  01/03/2018 11:08   Ct Head Wo Contrast  Result Date: 01/03/2018 CLINICAL DATA:  Altered mental status EXAM: CT HEAD WITHOUT CONTRAST TECHNIQUE: Contiguous axial images were obtained from the base of the skull through the vertex without intravenous contrast. COMPARISON:  10/29/2015 FINDINGS: Brain: Old infarcts in the right posterior temporal lobe and left posterior parietal lobe. These are stable. Old bilateral basal ganglia lacunar infarcts. There is atrophy and chronic small vessel disease changes. No acute intracranial abnormality. Specifically, no hemorrhage, hydrocephalus, mass lesion, acute infarction, or significant intracranial injury. Vascular: No hyperdense vessel or unexpected calcification. Skull: No acute calvarial abnormality. Sinuses/Orbits: Visualized paranasal sinuses and mastoids clear. Orbital soft tissues unremarkable. Other: None IMPRESSION: Atrophy, chronic small vessel disease. 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 No acute intracranial abnormality. Electronically Signed   By: Charlett Nose M.D.   On: 01/03/2018 11:22    Procedures Procedures (including critical care time)  Medications Ordered in ED Medications - No data to display   Initial Impression / Assessment and Plan / ED Course  I have reviewed the triage vital signs and the nursing notes.  Pertinent labs & imaging results that were available during my care of the patient were reviewed by me and considered in my medical decision making (see chart for details).     Patient well-appearing.  Mentating well.  No focal neuro deficits on exam.  Will obtain labs, EKG, CT head.  Patient seen here recently for problems with hyperglycemia.  Feel that patient's symptoms are possibly related to episodes of hypoglycemia, given that they are transient and seem to be occurring at nighttime.  Patient with initial CBG of 225, anion gap within normal limits, no clinical symptoms for  DKA. Notified by nursing staff that on recheck of blood sugar, patient dropped from 2 25-77.  Given meal tray.  Patient continues to be alert and oriented.  Initial troponin elevated, patient continues to deny chest pain or shortness of breath.  He is previous ER chart reviewed by me.  It was noted that he had a i-STAT troponin during his previous visit that was recorded as normal.  Will order delta troponin and reassess  On recheck patient continues to remain asymptomatic.  EKG does not show significant change from previous.  Remaining work-up reassuring.  Blood sugar improved.  Delta troponin unchanged.  Discussed care plan and work-up with Dr. Effie Shy who is also evaluated the patient.  It is felt that his symptoms are  related to episodes of hypoglycemia.  Patient remains asymptomatic and felt to be appropriate for discharge home.  Will have him decrease his evening insulin to 30 units and he agrees to close follow-up with his PCP.  Patient also given strict return precautions if symptoms worsen or he develops chest pain or shortness of breath.  Patient verbalized understanding and agrees to plan.    Final Clinical Impressions(s) / ED Diagnoses   Final diagnoses:  Poorly controlled diabetes mellitus (HCC)  Transient confusion    ED Discharge Orders    None       Rosey Bath 01/04/18 1921    Mancel Bale, MD 01/05/18 1100

## 2019-08-27 ENCOUNTER — Encounter (HOSPITAL_COMMUNITY): Payer: Self-pay

## 2019-08-27 ENCOUNTER — Emergency Department (HOSPITAL_COMMUNITY): Payer: Medicare Other

## 2019-08-27 ENCOUNTER — Other Ambulatory Visit: Payer: Self-pay

## 2019-08-27 ENCOUNTER — Inpatient Hospital Stay (HOSPITAL_COMMUNITY)
Admission: EM | Admit: 2019-08-27 | Discharge: 2019-09-03 | DRG: 064 | Disposition: A | Discharge status: Skilled Nursing Facility | Payer: Medicare Other | Attending: Internal Medicine | Admitting: Internal Medicine

## 2019-08-27 ENCOUNTER — Observation Stay (HOSPITAL_COMMUNITY): Payer: Medicare Other

## 2019-08-27 DIAGNOSIS — Z87891 Personal history of nicotine dependence: Secondary | ICD-10-CM

## 2019-08-27 DIAGNOSIS — E038 Other specified hypothyroidism: Secondary | ICD-10-CM | POA: Diagnosis present

## 2019-08-27 DIAGNOSIS — I110 Hypertensive heart disease with heart failure: Secondary | ICD-10-CM | POA: Diagnosis present

## 2019-08-27 DIAGNOSIS — Z794 Long term (current) use of insulin: Secondary | ICD-10-CM

## 2019-08-27 DIAGNOSIS — E1142 Type 2 diabetes mellitus with diabetic polyneuropathy: Secondary | ICD-10-CM | POA: Diagnosis present

## 2019-08-27 DIAGNOSIS — E669 Obesity, unspecified: Secondary | ICD-10-CM | POA: Diagnosis present

## 2019-08-27 DIAGNOSIS — G459 Transient cerebral ischemic attack, unspecified: Secondary | ICD-10-CM | POA: Diagnosis not present

## 2019-08-27 DIAGNOSIS — I639 Cerebral infarction, unspecified: Secondary | ICD-10-CM | POA: Diagnosis not present

## 2019-08-27 DIAGNOSIS — E785 Hyperlipidemia, unspecified: Secondary | ICD-10-CM | POA: Diagnosis present

## 2019-08-27 DIAGNOSIS — Z6832 Body mass index (BMI) 32.0-32.9, adult: Secondary | ICD-10-CM

## 2019-08-27 DIAGNOSIS — I69320 Aphasia following cerebral infarction: Secondary | ICD-10-CM

## 2019-08-27 DIAGNOSIS — F05 Delirium due to known physiological condition: Secondary | ICD-10-CM | POA: Diagnosis not present

## 2019-08-27 DIAGNOSIS — Z79899 Other long term (current) drug therapy: Secondary | ICD-10-CM

## 2019-08-27 DIAGNOSIS — E119 Type 2 diabetes mellitus without complications: Secondary | ICD-10-CM

## 2019-08-27 DIAGNOSIS — R739 Hyperglycemia, unspecified: Secondary | ICD-10-CM

## 2019-08-27 DIAGNOSIS — G9389 Other specified disorders of brain: Secondary | ICD-10-CM | POA: Diagnosis present

## 2019-08-27 DIAGNOSIS — R29706 NIHSS score 6: Secondary | ICD-10-CM | POA: Diagnosis present

## 2019-08-27 DIAGNOSIS — I2510 Atherosclerotic heart disease of native coronary artery without angina pectoris: Secondary | ICD-10-CM | POA: Diagnosis present

## 2019-08-27 DIAGNOSIS — Z951 Presence of aortocoronary bypass graft: Secondary | ICD-10-CM

## 2019-08-27 DIAGNOSIS — I5031 Acute diastolic (congestive) heart failure: Secondary | ICD-10-CM | POA: Diagnosis present

## 2019-08-27 DIAGNOSIS — E1140 Type 2 diabetes mellitus with diabetic neuropathy, unspecified: Secondary | ICD-10-CM | POA: Diagnosis present

## 2019-08-27 DIAGNOSIS — E1165 Type 2 diabetes mellitus with hyperglycemia: Secondary | ICD-10-CM | POA: Diagnosis present

## 2019-08-27 DIAGNOSIS — I10 Essential (primary) hypertension: Secondary | ICD-10-CM | POA: Diagnosis present

## 2019-08-27 DIAGNOSIS — F8081 Childhood onset fluency disorder: Secondary | ICD-10-CM | POA: Diagnosis present

## 2019-08-27 DIAGNOSIS — Z20822 Contact with and (suspected) exposure to covid-19: Secondary | ICD-10-CM | POA: Diagnosis present

## 2019-08-27 DIAGNOSIS — Z8673 Personal history of transient ischemic attack (TIA), and cerebral infarction without residual deficits: Secondary | ICD-10-CM

## 2019-08-27 DIAGNOSIS — G9349 Other encephalopathy: Secondary | ICD-10-CM | POA: Diagnosis present

## 2019-08-27 LAB — COMPREHENSIVE METABOLIC PANEL
ALT: 12 U/L (ref 0–44)
AST: 12 U/L — ABNORMAL LOW (ref 15–41)
Albumin: 3.6 g/dL (ref 3.5–5.0)
Alkaline Phosphatase: 54 U/L (ref 38–126)
Anion gap: 8 (ref 5–15)
BUN: 25 mg/dL — ABNORMAL HIGH (ref 8–23)
CO2: 25 mmol/L (ref 22–32)
Calcium: 9 mg/dL (ref 8.9–10.3)
Chloride: 103 mmol/L (ref 98–111)
Creatinine, Ser: 1.18 mg/dL (ref 0.61–1.24)
GFR calc Af Amer: >60 mL/min (ref >60)
GFR calc non Af Amer: 58 mL/min — ABNORMAL LOW (ref >60)
Glucose, Bld: 397 mg/dL — ABNORMAL HIGH (ref 70–99)
Potassium: 4.5 mmol/L (ref 3.5–5.1)
Sodium: 136 mmol/L (ref 135–145)
Total Bilirubin: 0.8 mg/dL (ref 0.3–1.2)
Total Protein: 6.4 g/dL — ABNORMAL LOW (ref 6.5–8.1)

## 2019-08-27 LAB — APTT: aPTT: 30 seconds (ref 24–36)

## 2019-08-27 LAB — RAPID URINE DRUG SCREEN, HOSP PERFORMED
Amphetamines: NOT DETECTED
Barbiturates: NOT DETECTED
Benzodiazepines: NOT DETECTED
Cocaine: NOT DETECTED
Opiates: NOT DETECTED
Tetrahydrocannabinol: NOT DETECTED

## 2019-08-27 LAB — URINALYSIS, ROUTINE W REFLEX MICROSCOPIC
Bilirubin Urine: NEGATIVE
Glucose, UA: >=500 mg/dL — AB
Hgb urine dipstick: NEGATIVE
Ketones, ur: 5 mg/dL — AB
Leukocytes,Ua: NEGATIVE
Nitrite: NEGATIVE
Protein, ur: NEGATIVE mg/dL
Specific Gravity, Urine: 1.022 (ref 1.005–1.030)
pH: 5 (ref 5.0–8.0)

## 2019-08-27 LAB — DIFFERENTIAL
Abs Immature Granulocytes: 0.02 10*3/uL (ref 0.00–0.07)
Basophils Absolute: 0 10*3/uL (ref 0.0–0.1)
Basophils Relative: 1 %
Eosinophils Absolute: 0.1 10*3/uL (ref 0.0–0.5)
Eosinophils Relative: 1 %
Immature Granulocytes: 0 %
Lymphocytes Relative: 17 %
Lymphs Abs: 0.9 10*3/uL (ref 0.7–4.0)
Monocytes Absolute: 0.3 10*3/uL (ref 0.1–1.0)
Monocytes Relative: 6 %
Neutro Abs: 4.3 10*3/uL (ref 1.7–7.7)
Neutrophils Relative %: 75 %

## 2019-08-27 LAB — CBC
HCT: 41.1 % (ref 39.0–52.0)
Hemoglobin: 13.4 g/dL (ref 13.0–17.0)
MCH: 30 pg (ref 26.0–34.0)
MCHC: 32.6 g/dL (ref 30.0–36.0)
MCV: 92.2 fL (ref 80.0–100.0)
Platelets: 254 10*3/uL (ref 150–400)
RBC: 4.46 MIL/uL (ref 4.22–5.81)
RDW: 13.1 % (ref 11.5–15.5)
WBC: 5.7 10*3/uL (ref 4.0–10.5)
nRBC: 0 % (ref 0.0–0.2)

## 2019-08-27 LAB — RESPIRATORY PANEL BY RT PCR (FLU A&B, COVID)
Influenza A by PCR: NEGATIVE
Influenza B by PCR: NEGATIVE
SARS Coronavirus 2 by RT PCR: NEGATIVE

## 2019-08-27 LAB — PROTIME-INR
INR: 1 (ref 0.8–1.2)
Prothrombin Time: 12.9 seconds (ref 11.4–15.2)

## 2019-08-27 LAB — ETHANOL: Alcohol, Ethyl (B): <10 mg/dL (ref <10)

## 2019-08-27 LAB — CBG MONITORING, ED
Glucose-Capillary: 378 mg/dL — ABNORMAL HIGH (ref 70–99)
Glucose-Capillary: 224 mg/dL — ABNORMAL HIGH (ref 70–99)

## 2019-08-27 MED ORDER — STROKE: EARLY STAGES OF RECOVERY BOOK
Freq: Once | Status: AC
  Filled 2019-08-27: qty 1

## 2019-08-27 MED ORDER — INSULIN ASPART 100 UNIT/ML ~~LOC~~ SOLN
10.0000 [IU] | Freq: Once | SUBCUTANEOUS | Status: AC
  Administered 2019-08-27: 10 [IU] via INTRAVENOUS
  Filled 2019-08-27: qty 1

## 2019-08-27 MED ORDER — METFORMIN HCL 500 MG PO TABS
500.0000 mg | ORAL_TABLET | Freq: Two times a day (BID) | ORAL | Status: DC
  Filled 2019-08-27: qty 1

## 2019-08-27 MED ORDER — ASPIRIN 325 MG PO TABS
325.0000 mg | ORAL_TABLET | Freq: Every day | ORAL | Status: DC
  Administered 2019-08-27 – 2019-09-03 (×8): 325 mg via ORAL
  Filled 2019-08-27 (×8): qty 1

## 2019-08-27 MED ORDER — ASPIRIN 300 MG RE SUPP
300.0000 mg | Freq: Every day | RECTAL | Status: DC
  Filled 2019-08-27: qty 1

## 2019-08-27 MED ORDER — INSULIN GLARGINE 100 UNIT/ML ~~LOC~~ SOLN
40.0000 [IU] | Freq: Every day | SUBCUTANEOUS | Status: DC
  Administered 2019-08-28 – 2019-08-29 (×2): 40 [IU] via SUBCUTANEOUS
  Filled 2019-08-27 (×5): qty 0.4

## 2019-08-27 MED ORDER — INSULIN ASPART 100 UNIT/ML ~~LOC~~ SOLN
0.0000 [IU] | Freq: Three times a day (TID) | SUBCUTANEOUS | Status: DC
  Administered 2019-08-28: 3 [IU] via SUBCUTANEOUS
  Administered 2019-08-28: 8 [IU] via SUBCUTANEOUS

## 2019-08-27 MED ORDER — NITROGLYCERIN 0.4 MG SL SUBL: 0.4000 mg | SUBLINGUAL_TABLET | SUBLINGUAL | Status: DC | PRN

## 2019-08-27 NOTE — ED Triage Notes (Signed)
Per EMS, patient was talking to his wife and then he stopped talking. He never lose consciousness and states this happens when his CBG is elevated. Given NS by EMS. CBG with EMS was 411. Pt is alert and oriented. NAD.

## 2019-08-27 NOTE — ED Notes (Signed)
Pt is aware we need urine sample, urinal at bedside.  

## 2019-08-27 NOTE — H&P (Signed)
History and Physical    Abdulaziz Toman Mattern PPI:[id number] DOB: 1940/11/07 DOA: 08/27/2019  PCP: Patient, No Pcp Per   Patient coming from: Home.  I have personally briefly reviewed patient's old medical records in [facility]  Chief Complaint: "High blood sugar".  HPI: William Sharp is a 79 y.o. male with medical history significant of CAD, type 2 diabetes, diabetic peripheral neuropathy, hyperlipidemia, hypothyroidism, obesity, TIA who is brought to the emergency department via EMS after stopping to talk to his wife while he was in the parking lot ready to go to the pharmacy to pick up his insulin.  He denies losing consciousness, but states that this sometimes happens when his glucose is elevated.  By the time the ambulance crew arrived, his symptoms have resolved.  His CBG on the scene was 411 mg/dL.  EMS gave him 500 mL of NS.  He denies headache, blurred vision, polyuria, polydipsia, but complains of recently worsening nocturia.  No fever, chills, rhinorrhea, sore throat, dyspnea, cough, wheezing or hemoptysis.  He denies chest pain, palpitations, dizziness, diaphoresis, PND, orthopnea or pitting edema of the lower extremities.  No abdominal pain, diarrhea, constipation, melena or hematochezia.  No dysuria, frequency or hematuria.  ED Course: Initial vital signs temperature 98.1 F, pulse 76, respiration 14, blood pressure 134/54 mmHg and O2 sat 94% on room air.  The patient received 10 units of insulin IV.  CBC was normal.  PT/INR/APTT unremarkable.  Still alcohol was negative.  CMP shows a glucose of 397 BUN at 95 mg/dL.  Total protein 6.4 g/dL and AST was 12 units/L.  All other CMP values are remarkable.  Respiratory panel by PCR was negative for SARS coronavirus 2 or influenza A/B.EKG shows prolonged PR, nonspecific IVCD leg, old Q waves consistent with inferior infarct, please see tracing for further detail.  Imaging: His chest radiograph showed minimal, scaring or atelectasis  on the left base, cardiomegaly without vascular congestion or focal airspace disease.  Possible hiatal hernia.  CT head did not show any acute intracranial abnormality.  There is remote right posterior temporal and left parietal infarct.  Chronic appearing basal ganglia lacunar infarcts as well.  There is chronic microvascular angiopathy and parenchymal volume loss.  Please see images and full radiology report for further detail.  Review of Systems: As per HPI otherwise 10 point review of systems negative.   Past Medical History:  Diagnosis Date  . CAD (coronary artery disease)   . Diabetes mellitus   . Hyperlipemia   . Hypothyroidism   . Obesity   . TIA (transient ischemic attack)     Past Surgical History:  Procedure Laterality Date  . AMPUTATION     Right second toe/nonhealing ulcer  . CIRCUMCISION  04/29/2014  . CORONARY ARTERY BYPASS GRAFT     2 vessel 2000, [facility]  . TRACHEOSTOMY     Age 30     reports that he quit smoking about 28 years ago. He has never used smokeless tobacco. He reports that he does not drink alcohol or use drugs.  No Known Allergies  Family History  Problem Relation Age of Onset  . Cancer Father        Lung  . Cancer Mother        Breast  . Cancer Brother        Lung   Prior to Admission medications   Medication Sig Start Date End Date Taking? Authorizing Provider  LANTUS SOLOSTAR 100 UNIT/ML Solostar Pen Inject  40 Units into the skin daily.  08/11/19  Yes [provider]  metFORMIN (GLUCOPHAGE) 500 MG tablet Take 500 mg by mouth 2 (two) times daily with a meal.   Yes [provider]  nitroGLYCERIN (NITROSTAT) 0.4 MG SL tablet Place 1 tablet (0.4 mg total) under the tongue every 5 (five) minutes as needed. 04/26/15  Yes Claretta Fraise, MD    Physical Exam: Vitals:   08/27/19 1630 08/27/19 1632 08/27/19 1800 08/27/19 1900  BP: (!) 134/54 (!) 134/54 (!) 143/71 137/80  Pulse: 76 76 74 62  Resp: 14 14 19 20   Temp:  98.1 F  (36.7 C)    TempSrc:  Oral    SpO2: 94% 95% 98% 98%  Weight:      Height:        Constitutional: NAD, calm, comfortable Eyes: PERRL, lids and conjunctivae normal ENMT: Mucous membranes are moist. Posterior pharynx clear of any exudate or lesions. Neck: normal, supple, no masses, no thyromegaly Respiratory: clear to auscultation bilaterally, no wheezing, no crackles. Normal respiratory effort. No accessory muscle use.  Cardiovascular: Regular rate and rhythm, no murmurs / rubs / gallops. No extremity edema. 2+ pedal pulses. No carotid bruits.  Abdomen: Midline surgical scar extending to thorax, obese, nondistended.  BS positive.  Soft, no tenderness, no masses palpated. No hepatosplenomegaly. Musculoskeletal: Mild generalized/nonfocal weakness.  No clubbing / cyanosis. Good ROM, no contractures. Normal muscle tone.  Skin: Scattered areas of ecchymosis on extremities. Neurologic: CN 2-12 grossly intact. Sensation intact, DTR normal. Strength 5/5 in all 4.  Psychiatric: Normal judgment and insight. Alert and oriented x 3. Normal mood.   Labs on Admission: I have personally reviewed following labs and imaging studies  CBC: Recent Labs  Lab 08/27/19 1658  WBC 5.7  NEUTROABS 4.3  HGB 13.4  HCT 41.1  MCV 92.2  PLT 353   Basic Metabolic Panel: Recent Labs  Lab 08/27/19 1658  NA 136  K 4.5  CL 103  CO2 25  GLUCOSE 397*  BUN 25*  CREATININE 1.18  CALCIUM 9.0   GFR: Estimated Creatinine Clearance: 63.6 mL/min (by C-G formula based on SCr of 1.18 mg/dL). Liver Function Tests: Recent Labs  Lab 08/27/19 1658  AST 12*  ALT 12  ALKPHOS 54  BILITOT 0.8  PROT 6.4*  ALBUMIN 3.6   No results for input(s): LIPASE, AMYLASE in the last 168 hours. No results for input(s): AMMONIA in the last 168 hours. Coagulation Profile: Recent Labs  Lab 08/27/19 1658  INR 1.0   Cardiac Enzymes: No results for input(s): CKTOTAL, CKMB, CKMBINDEX, TROPONINI in the last 168 hours. BNP  (last 3 results) No results for input(s): PROBNP in the last 8760 hours. HbA1C: No results for input(s): HGBA1C in the last 72 hours. CBG: Recent Labs  Lab 08/27/19 1625 08/27/19 1914  GLUCAP 378* 224*   Lipid Profile: No results for input(s): CHOL, HDL, LDLCALC, TRIG, CHOLHDL, LDLDIRECT in the last 72 hours. Thyroid Function Tests: No results for input(s): TSH, T4TOTAL, FREET4, T3FREE, THYROIDAB in the last 72 hours. Anemia Panel: No results for input(s): VITAMINB12, FOLATE, FERRITIN, TIBC, IRON, RETICCTPCT in the last 72 hours. Urine analysis:    Component Value Date/Time   COLORURINE STRAW (A) 01/03/2018 1035   APPEARANCEUR CLEAR 01/03/2018 1035   LABSPEC 1.011 01/03/2018 1035   PHURINE 5.0 01/03/2018 1035   GLUCOSEU 150 (A) 01/03/2018 1035   HGBUR NEGATIVE 01/03/2018 1035   BILIRUBINUR NEGATIVE 01/03/2018 1035   BILIRUBINUR negative 12/23/2014 1613  KETONESUR NEGATIVE 01/03/2018 1035   PROTEINUR NEGATIVE 01/03/2018 1035   UROBILINOGEN negative 12/23/2014 1613   NITRITE NEGATIVE 01/03/2018 1035   LEUKOCYTESUR NEGATIVE 01/03/2018 1035    Radiological Exams on Admission: CT HEAD WO CONTRAST  Result Date: 08/27/2019 CLINICAL DATA:  TIA EXAM: CT HEAD WITHOUT CONTRAST TECHNIQUE: Contiguous axial images were obtained from the base of the skull through the vertex without intravenous contrast. COMPARISON:  CT head January 03, 2018 FINDINGS: Brain: Remote right posterior temporal and left parietal infarcts are unchanged from comparison exam. Few remote appearing lacunar type infarcts are noted in the bilateral basal ganglia. No evidence of acute infarction, hemorrhage, hydrocephalus, extra-axial collection or mass lesion/mass effect. Symmetric prominence of the ventricles, cisterns and sulci compatible with parenchymal volume loss. Patchy areas of white matter hypoattenuation are most compatible with chronic microvascular angiopathy. Faint senescent mineralization of the basal ganglia  is noted. Vascular: Atherosclerotic calcification of the carotid siphons and intradural vertebral arteries. No hyperdense vessel. Skull: Chronic soft tissue thickening of the posterior left high parietal scalp may reflect an area of scarring. No calvarial fracture or suspicious osseous lesion. No scalp swelling or hematoma. Sinuses/Orbits: Minimal mural thickening in the ethmoid sinuses. Remaining paranasal sinuses and mastoid air cells are predominantly clear. Small amount of debris in the right external auditory canal may reflect cerumen. Orbital structures are unremarkable aside from prior lens extractions. Other: None IMPRESSION: 1. No acute intracranial abnormality. 2. Remote right posterior temporal and left parietal infarcts. Chronic appearing a basal ganglia lacunar infarcts as well. 3. Chronic microvascular angiopathy and parenchymal volume loss. Electronically Signed   By: Kreg Shropshire M.D.   On: 08/27/2019 17:37    EKG: Independently reviewed.  Vent. rate 76 BPM PR interval * ms QRS duration 128 ms QT/QTc 393/442 ms P-R-T axes 24 72 254 Sinus rhythm Prolonged PR interval Nonspecific intraventricular conduction delay Inferior infarct, age indeterminate Abnormal lateral Q waves  Assessment/Plan Principal Problem:   TIA (transient ischemic attack) Observation/telemetry. Frequent neuro checks. Swallow screen. PT/OT/SLP evaluation. Check fasting lipids. Check hemoglobin A1c. Check carotid Doppler. Check echocardiogram. Neurology evaluation.  Active Problems:   CAD (coronary artery disease) No recent CP or other symptoms. Only on NTG as needed. Has not seen cardiology in a while. Resume daily aspirin. Check fasting lipids. Check echocardiogram. Follow-up with PCP and cardiology as an outpatient.    HTN (hypertension), benign Not on antihypertensives. Monitor BP. Allow permissive hypertension.    Type 2 diabetes mellitus (HCC) Carbohydrate modified diet. Continue  metformin. Continue Lantus 40 units in the morning. CBG monitoring with [facility]. Check hemoglobin A1c.    Diabetic neuropathy (HCC) No significant symptoms at the moment. Needs better DM control. Check hemoglobin A1c. Follow-up with PCP.    Hyperlipemia Check fasting lipids.    Obesity Needs lifestyle modifications. Follow-up with PCP.    Other specified hypothyroidism Not on levothyroxine. Check TSH level.    DVT prophylaxis: SCDs. Code Status: Full code. Family Communication: Disposition Plan: Observation for TIA monitoring and work-up. Consults called: Admission status: Observation/telemetry.   Bobette Mo MD [facility]  If 7PM-7AM, please contact night-coverage [email or web address]  08/27/2019, 7:22 PM   This document was prepared using Dragon voice recognition software and may contain some unintended transcription errors.

## 2019-08-27 NOTE — ED Notes (Signed)
Pt not able to void at this time.

## 2019-08-27 NOTE — Plan of Care (Signed)

## 2019-08-27 NOTE — ED Provider Notes (Signed)
[facility] Provider Note   CSN: [id number] Arrival date & time: 08/27/19  1616     History Chief Complaint  Patient presents with  . Hyperglycemia    William Sharp is a 79 y.o. male.  Patient was at [facility] somewhere around 2 PM in the afternoon he was there to get his insulin picked up he was there with his wife.  Patient suddenly became aphasic was not able to talk or communicate.  He went was driven home.  But he was too weak to get out of the car he still was not communicating.  EMS was called.  When EMS got him into the ambulance speech came back.  Upon arrival here patient without any focal deficit.  Patient has a history of strokes in the past.  Also diabetic.  Blood sugars were elevated EMS gave him 500 cc bolus of fluid.  Blood sugars were in the 300s when he arrived here.  Patient stated that he felt fine yesterday and feels fine now.  Not exactly sure what happened at [facility].  Patient without any focal deficits.  But concerns for a TIA.  Stroke order set ordered.  Code stroke not initiated.  Since patient was now asymptomatic.  Not a candidate for TPA.        Past Medical History:  Diagnosis Date  . CAD (coronary artery disease)   . Diabetes mellitus   . Hyperlipemia   . Hypothyroidism   . Obesity   . TIA (transient ischemic attack)     Patient Active Problem List   Diagnosis Date Noted  . Cellulitis and abscess of leg 05/11/2015  . Other specified hypothyroidism 01/21/2015  . Long-term insulin use ([place]) 10/19/2014  . CAD (coronary artery disease)   . HTN (hypertension), benign   . Diabetic neuropathy ([place])   . Hyperlipemia   . Obesity     Past Surgical History:  Procedure Laterality Date  . AMPUTATION     Right second toe/nonhealing ulcer  . CIRCUMCISION  04/29/2014  . CORONARY ARTERY BYPASS GRAFT     2 vessel 2000, [place] [facility]  . TRACHEOSTOMY     Age 67       Family History  Problem Relation Age of Onset  . Cancer  Father        Lung  . Cancer Mother        Breast  . Cancer Brother        Lung    Social History   Tobacco Use  . Smoking status: Former Smoker    Quit date: 08/23/1991    Years since quitting: 28.0  . Smokeless tobacco: Never Used  Substance Use Topics  . Alcohol use: Never  . Drug use: Never    Home Medications Prior to Admission medications   Medication Sig Start Date End Date Taking? Authorizing Provider  insulin NPH-regular Human (NOVOLIN 70/30) (70-30) 100 UNIT/ML injection 50 units before breakfast and 40 units before supper 04/26/15   Claretta Fraise, MD  LANTUS SOLOSTAR 100 UNIT/ML Solostar Pen Inject 40 Units into the skin daily.  08/11/19   [provider]  metFORMIN (GLUCOPHAGE) 500 MG tablet Take 500 mg by mouth 2 (two) times daily with a meal.    [provider]  nitroGLYCERIN (NITROSTAT) 0.4 MG SL tablet Place 1 tablet (0.4 mg total) under the tongue every 5 (five) minutes as needed. 04/26/15   Claretta Fraise, MD    Allergies    Patient has no  allergy information on record.  Review of Systems   Review of Systems  Constitutional: Negative for chills and fever.  HENT: Negative for congestion, rhinorrhea and sore throat.   Eyes: Negative for visual disturbance.  Respiratory: Negative for cough and shortness of breath.   Cardiovascular: Negative for chest pain and leg swelling.  Gastrointestinal: Negative for abdominal pain, diarrhea, nausea and vomiting.  Genitourinary: Negative for dysuria.  Musculoskeletal: Negative for back pain and neck pain.  Skin: Negative for rash.  Neurological: Positive for speech difficulty and weakness. Negative for dizziness, light-headedness and headaches.  Hematological: Does not bruise/bleed easily.  Psychiatric/Behavioral: Negative for confusion.    Physical Exam Updated Vital Signs BP (!) 143/71   Pulse 74   Temp 98.1 F (36.7 C) (Oral)   Resp 19   Ht 1.829 m (6')   Wt 108.9 kg   SpO2 98%   BMI  32.55 kg/m   Physical Exam Vitals and nursing note reviewed.  Constitutional:      Appearance: Normal appearance. He is well-developed.  HENT:     Head: Normocephalic and atraumatic.  Eyes:     Extraocular Movements: Extraocular movements intact.     Conjunctiva/sclera: Conjunctivae normal.     Pupils: Pupils are equal, round, and reactive to light.  Cardiovascular:     Rate and Rhythm: Normal rate and regular rhythm.     Heart sounds: No murmur.  Pulmonary:     Effort: Pulmonary effort is normal. No respiratory distress.     Breath sounds: Normal breath sounds.  Abdominal:     Palpations: Abdomen is soft.     Tenderness: There is no abdominal tenderness.  Musculoskeletal:        General: No swelling. Normal range of motion.     Cervical back: Normal range of motion and neck supple.     Comments: Patient missing some toes.  Partially  Skin:    General: Skin is warm and dry.     Capillary Refill: Capillary refill takes less than 2 seconds.  Neurological:     General: No focal deficit present.     Mental Status: He is alert and oriented to person, place, and time.     Cranial Nerves: No cranial nerve deficit.     Sensory: No sensory deficit.     Motor: No weakness.     Coordination: Coordination normal.     ED Results / Procedures / Treatments   Labs (all labs ordered are listed, but only abnormal results are displayed) Labs Reviewed  COMPREHENSIVE METABOLIC PANEL - Abnormal; Notable for the following components:      Result Value   Glucose, Bld 397 (*)    BUN 25 (*)    Total Protein 6.4 (*)    AST 12 (*)    GFR calc non Af Amer 58 (*)    All other components within normal limits  CBG MONITORING, ED - Abnormal; Notable for the following components:   Glucose-Capillary 378 (*)    All other components within normal limits  RESPIRATORY PANEL BY RT PCR (FLU A&B, COVID)  ETHANOL  PROTIME-INR  APTT  CBC  DIFFERENTIAL  RAPID URINE DRUG SCREEN, HOSP PERFORMED    URINALYSIS, ROUTINE W REFLEX MICROSCOPIC    EKG EKG Interpretation  Date/Time:  Wednesday August 27 2019 16:32:30 EDT Ventricular Rate:  76 PR Interval:    QRS Duration: 128 QT Interval:  393 QTC Calculation: 442 R Axis:   72 Text Interpretation: Sinus rhythm Prolonged PR  interval Nonspecific intraventricular conduction delay Inferior infarct, age indeterminate Abnormal lateral Q waves No significant change since last tracing Confirmed by Vanetta Mulders [phone]) on 08/27/2019 5:37:25 PM   Radiology CT HEAD WO CONTRAST  Result Date: 08/27/2019 CLINICAL DATA:  TIA EXAM: CT HEAD WITHOUT CONTRAST TECHNIQUE: Contiguous axial images were obtained from the base of the skull through the vertex without intravenous contrast. COMPARISON:  CT head January 03, 2018 FINDINGS: Brain: Remote right posterior temporal and left parietal infarcts are unchanged from comparison exam. Few remote appearing lacunar type infarcts are noted in the bilateral basal ganglia. No evidence of acute infarction, hemorrhage, hydrocephalus, extra-axial collection or mass lesion/mass effect. Symmetric prominence of the ventricles, cisterns and sulci compatible with parenchymal volume loss. Patchy areas of white matter hypoattenuation are most compatible with chronic microvascular angiopathy. Faint senescent mineralization of the basal ganglia is noted. Vascular: Atherosclerotic calcification of the carotid siphons and intradural vertebral arteries. No hyperdense vessel. Skull: Chronic soft tissue thickening of the posterior left high parietal scalp may reflect an area of scarring. No calvarial fracture or suspicious osseous lesion. No scalp swelling or hematoma. Sinuses/Orbits: Minimal mural thickening in the ethmoid sinuses. Remaining paranasal sinuses and mastoid air cells are predominantly clear. Small amount of debris in the right external auditory canal may reflect cerumen. Orbital structures are unremarkable aside from prior  lens extractions. Other: None IMPRESSION: 1. No acute intracranial abnormality. 2. Remote right posterior temporal and left parietal infarcts. Chronic appearing a basal ganglia lacunar infarcts as well. 3. Chronic microvascular angiopathy and parenchymal volume loss. Electronically Signed   By: Kreg Shropshire M.D.   On: 08/27/2019 17:37    Procedures Procedures (including critical care time)  CRITICAL CARE Performed by: Vanetta Mulders Total critical care time: 30 minutes Critical care time was exclusive of separately billable procedures and treating other patients. Critical care was necessary to treat or prevent imminent or life-threatening deterioration. Critical care was time spent personally by me on the following activities: development of treatment plan with patient and/or surrogate as well as nursing, discussions with consultants, evaluation of patient's response to treatment, examination of patient, obtaining history from patient or surrogate, ordering and performing treatments and interventions, ordering and review of laboratory studies, ordering and review of radiographic studies, pulse oximetry and re-evaluation of patient's condition.   Medications Ordered in ED Medications  insulin aspart (novoLOG) injection 10 Units (has no administration in time range)    ED Course  I have reviewed the triage vital signs and the nursing notes.  Pertinent labs & imaging results that were available during my care of the patient were reviewed by me and considered in my medical decision making (see chart for details).    MDM Rules/Calculators/A&P                       By history symptoms consistent with a TIA.  Head CT shows evidence of old strokes.  But nothing acute.  MRI not available.  Patient's blood sugar remains in 300s no evidence of any metabolic acidosis or ketoacidosis.  Patient to receive 10 units of regular insulin IV.  Labs without significant abnormalities.  EKG normal  sinus.  Patient not meeting criteria for TPA.  Is a code stroke not initiated.  But stroke order set used.  Discussed with hospitalist who will admit.  Patient can get MRI in the morning.  Blood sugar control in the meantime.     Final Clinical Impression(s) / ED Diagnoses  Final diagnoses:  TIA (transient ischemic attack)    Rx / DC Orders ED Discharge Orders    None       Vanetta Mulders, MD 08/27/19 1945

## 2019-08-28 ENCOUNTER — Encounter (HOSPITAL_COMMUNITY): Payer: Self-pay | Admitting: Internal Medicine

## 2019-08-28 ENCOUNTER — Observation Stay (HOSPITAL_COMMUNITY): Payer: Medicare Other

## 2019-08-28 DIAGNOSIS — Z794 Long term (current) use of insulin: Secondary | ICD-10-CM | POA: Diagnosis not present

## 2019-08-28 DIAGNOSIS — I639 Cerebral infarction, unspecified: Secondary | ICD-10-CM | POA: Diagnosis present

## 2019-08-28 DIAGNOSIS — E669 Obesity, unspecified: Secondary | ICD-10-CM | POA: Diagnosis present

## 2019-08-28 DIAGNOSIS — R29706 NIHSS score 6: Secondary | ICD-10-CM | POA: Diagnosis present

## 2019-08-28 DIAGNOSIS — Z20822 Contact with and (suspected) exposure to covid-19: Secondary | ICD-10-CM | POA: Diagnosis present

## 2019-08-28 DIAGNOSIS — I2510 Atherosclerotic heart disease of native coronary artery without angina pectoris: Secondary | ICD-10-CM | POA: Diagnosis present

## 2019-08-28 DIAGNOSIS — G9349 Other encephalopathy: Secondary | ICD-10-CM | POA: Diagnosis present

## 2019-08-28 DIAGNOSIS — G459 Transient cerebral ischemic attack, unspecified: Secondary | ICD-10-CM | POA: Diagnosis present

## 2019-08-28 DIAGNOSIS — I110 Hypertensive heart disease with heart failure: Secondary | ICD-10-CM | POA: Diagnosis present

## 2019-08-28 DIAGNOSIS — Z79899 Other long term (current) drug therapy: Secondary | ICD-10-CM | POA: Diagnosis not present

## 2019-08-28 DIAGNOSIS — I25118 Atherosclerotic heart disease of native coronary artery with other forms of angina pectoris: Secondary | ICD-10-CM

## 2019-08-28 DIAGNOSIS — E1142 Type 2 diabetes mellitus with diabetic polyneuropathy: Secondary | ICD-10-CM | POA: Diagnosis present

## 2019-08-28 DIAGNOSIS — Z6832 Body mass index (BMI) 32.0-32.9, adult: Secondary | ICD-10-CM | POA: Diagnosis not present

## 2019-08-28 DIAGNOSIS — E785 Hyperlipidemia, unspecified: Secondary | ICD-10-CM | POA: Diagnosis present

## 2019-08-28 DIAGNOSIS — E1165 Type 2 diabetes mellitus with hyperglycemia: Secondary | ICD-10-CM | POA: Diagnosis present

## 2019-08-28 DIAGNOSIS — F05 Delirium due to known physiological condition: Secondary | ICD-10-CM | POA: Diagnosis not present

## 2019-08-28 DIAGNOSIS — Z951 Presence of aortocoronary bypass graft: Secondary | ICD-10-CM | POA: Diagnosis not present

## 2019-08-28 DIAGNOSIS — Z8673 Personal history of transient ischemic attack (TIA), and cerebral infarction without residual deficits: Secondary | ICD-10-CM | POA: Diagnosis not present

## 2019-08-28 DIAGNOSIS — I10 Essential (primary) hypertension: Secondary | ICD-10-CM

## 2019-08-28 DIAGNOSIS — E038 Other specified hypothyroidism: Secondary | ICD-10-CM | POA: Diagnosis present

## 2019-08-28 DIAGNOSIS — F8081 Childhood onset fluency disorder: Secondary | ICD-10-CM | POA: Diagnosis present

## 2019-08-28 DIAGNOSIS — G9389 Other specified disorders of brain: Secondary | ICD-10-CM | POA: Diagnosis present

## 2019-08-28 DIAGNOSIS — Z87891 Personal history of nicotine dependence: Secondary | ICD-10-CM | POA: Diagnosis not present

## 2019-08-28 DIAGNOSIS — I5031 Acute diastolic (congestive) heart failure: Secondary | ICD-10-CM | POA: Diagnosis present

## 2019-08-28 DIAGNOSIS — I69320 Aphasia following cerebral infarction: Secondary | ICD-10-CM | POA: Diagnosis not present

## 2019-08-28 LAB — GLUCOSE, CAPILLARY
Glucose-Capillary: 156 mg/dL — ABNORMAL HIGH (ref 70–99)
Glucose-Capillary: 185 mg/dL — ABNORMAL HIGH (ref 70–99)
Glucose-Capillary: 256 mg/dL — ABNORMAL HIGH (ref 70–99)
Glucose-Capillary: 77 mg/dL (ref 70–99)
Glucose-Capillary: 147 mg/dL — ABNORMAL HIGH (ref 70–99)

## 2019-08-28 LAB — LIPID PANEL
Cholesterol: 183 mg/dL (ref 0–200)
HDL: 27 mg/dL — ABNORMAL LOW (ref >40)
LDL Cholesterol: 132 mg/dL — ABNORMAL HIGH (ref 0–99)
Total CHOL/HDL Ratio: 6.8 RATIO
Triglycerides: 118 mg/dL (ref <150)
VLDL: 24 mg/dL (ref 0–40)

## 2019-08-28 LAB — ECHOCARDIOGRAM COMPLETE
Height: 72 in
Weight: 3840 oz

## 2019-08-28 LAB — HEMOGLOBIN A1C
Hgb A1c MFr Bld: 11.9 % — ABNORMAL HIGH (ref 4.8–5.6)
Mean Plasma Glucose: 294.83 mg/dL

## 2019-08-28 MED ORDER — LIVING WELL WITH DIABETES BOOK: Freq: Once | Status: AC

## 2019-08-28 MED ORDER — CLOPIDOGREL BISULFATE 75 MG PO TABS
75.0000 mg | ORAL_TABLET | Freq: Every day | ORAL | Status: DC
  Administered 2019-08-29 – 2019-09-03 (×6): 75 mg via ORAL
  Filled 2019-08-28 (×6): qty 1

## 2019-08-28 MED ORDER — INSULIN ASPART 100 UNIT/ML ~~LOC~~ SOLN
0.0000 [IU] | Freq: Three times a day (TID) | SUBCUTANEOUS | Status: DC
  Administered 2019-08-29: 3 [IU] via SUBCUTANEOUS
  Administered 2019-08-29: 8 [IU] via SUBCUTANEOUS
  Administered 2019-08-31: 2 [IU] via SUBCUTANEOUS
  Administered 2019-08-31 – 2019-09-01 (×3): 3 [IU] via SUBCUTANEOUS
  Administered 2019-09-02: 2 [IU] via SUBCUTANEOUS

## 2019-08-28 MED ORDER — ROSUVASTATIN CALCIUM 10 MG PO TABS
10.0000 mg | ORAL_TABLET | Freq: Every evening | ORAL | Status: DC
  Administered 2019-08-29 – 2019-09-01 (×4): 10 mg via ORAL
  Filled 2019-08-28 (×5): qty 1

## 2019-08-28 MED ORDER — INSULIN ASPART 100 UNIT/ML ~~LOC~~ SOLN
5.0000 [IU] | Freq: Three times a day (TID) | SUBCUTANEOUS | Status: DC
  Administered 2019-08-28: 5 [IU] via SUBCUTANEOUS

## 2019-08-28 MED ORDER — INSULIN ASPART 100 UNIT/ML ~~LOC~~ SOLN: 8.0000 [IU] | Freq: Three times a day (TID) | SUBCUTANEOUS | Status: DC

## 2019-08-28 MED ORDER — INSULIN ASPART 100 UNIT/ML ~~LOC~~ SOLN: 0.0000 [IU] | Freq: Every day | SUBCUTANEOUS | Status: DC

## 2019-08-28 MED ORDER — INSULIN ASPART 100 UNIT/ML ~~LOC~~ SOLN: 10.0000 [IU] | Freq: Three times a day (TID) | SUBCUTANEOUS | Status: DC

## 2019-08-28 MED ORDER — LORAZEPAM 0.5 MG PO TABS
0.5000 mg | ORAL_TABLET | Freq: Once | ORAL | Status: AC
  Administered 2019-08-28: 0.5 mg via ORAL
  Filled 2019-08-28: qty 1

## 2019-08-28 NOTE — Evaluation (Signed)
Physical Therapy Evaluation Patient Details Name: Byrd Rushlow MRN: [id number] DOB: 05-21-1940 Today's Date: 08/28/2019   History of Present Illness  Thelonious Kauffmann is a 79 y.o. male with medical history significant of CAD, type 2 diabetes, diabetic peripheral neuropathy, hyperlipidemia, hypothyroidism, obesity, TIA who is brought to the emergency department via EMS after stopping to talk to his wife while he was in the parking lot ready to go to the pharmacy to pick up his insulin.  He denies losing consciousness, but states that this sometimes happens when his glucose is elevated.  By the time the ambulance crew arrived, his symptoms have resolved.  His CBG on the scene was 411 mg/dL.  EMS gave him 500 mL of NS.  He denies headache, blurred vision, polyuria, polydipsia, but complains of recently worsening nocturia.  No fever, chills, rhinorrhea, sore throat, dyspnea, cough, wheezing or hemoptysis.  He denies chest pain, palpitations, dizziness, diaphoresis, PND, orthopnea or pitting edema of the lower extremities.  No abdominal pain, diarrhea, constipation, melena or hematochezia.  No dysuria, frequency or hematuria.    Clinical Impression  Patient required much time with labored movement to sit up at bedside requiring use of bed rail, required bed raised to complete sit to stand using RW, demonstrates slow labored cadence with difficulty making turns and at moderate to high risk for falls without supervision/assistance.  Patient tolerated sitting up in chair after therapy - NT notified.  Patient will benefit from continued physical therapy in hospital and recommended venue below to increase strength, balance, endurance for safe ADLs and gait.     Follow Up Recommendations SNF;Supervision for mobility/OOB;Supervision - Intermittent    Equipment Recommendations  None recommended by PT    Recommendations for Other Services       Precautions / Restrictions Precautions Precautions:  Fall Restrictions Weight Bearing Restrictions: No      Mobility  Bed Mobility Overal bed mobility: Needs Assistance Bed Mobility: Supine to Sit     Supine to sit: Supervision     General bed mobility comments: required much time requiring use of bed rail to pull self up with bed flat  Transfers Overall transfer level: Needs assistance Equipment used: Rolling walker (2 wheeled) Transfers: Sit to/from [employer] Sit to Stand: Min assist;Min guard Stand pivot transfers: Min assist;Min guard       General transfer comment: slow labored movement requiring bed raised to complete sit to stand  Ambulation/Gait Ambulation/Gait assistance: Min assist Gait Distance (Feet): 40 Feet Assistive device: Rolling walker (2 wheeled) Gait Pattern/deviations: Decreased step length - left;Decreased step length - right;Decreased stride length Gait velocity: decreased   General Gait Details: slow labored cadence with short step/stride length, required much time to make turns, limited secondary to c/o fatigue  Stairs            Wheelchair Mobility    Modified Rankin (Stroke Patients Only)       Balance Overall balance assessment: Needs assistance Sitting-balance support: Feet supported;No upper extremity supported Sitting balance-Leahy Scale: Good Sitting balance - Comments: seated at EOB   Standing balance support: During functional activity;Bilateral upper extremity supported Standing balance-Leahy Scale: Fair Standing balance comment: using RW                             Pertinent Vitals/Pain Pain Assessment: No/denies pain    Home Living Family/patient expects to be discharged to:: Private residence Living Arrangements: Spouse/significant other Available  Help at Discharge: Family;Available PRN/intermittently Type of Home: House Home Access: Stairs to enter Entrance Stairs-Rails: Right Entrance Stairs-Number of Steps: 2 Home Layout: One  level Home Equipment: Walker - 2 wheels;Walker - 4 wheels;Cane - single point;Shower seat;Bedside commode;Other (comment) Additional Comments: uses upright walker most of time    Prior Function Level of Independence: Independent with assistive device(s)         Comments: household and short distanced community ambulator using upright walker mostly of time, does not drive     Hand Dominance   Dominant Hand: Right    Extremity/Trunk Assessment   Upper Extremity Assessment Upper Extremity Assessment: Defer to OT evaluation    Lower Extremity Assessment Lower Extremity Assessment: Generalized weakness    Cervical / Trunk Assessment Cervical / Trunk Assessment: Normal  Communication   Communication: No difficulties  Cognition Arousal/Alertness: Awake/alert Behavior During Therapy: WFL for tasks assessed/performed Overall Cognitive Status: History of cognitive impairments - at baseline                                        General Comments      Exercises     Assessment/Plan    PT Assessment Patient needs continued PT services  PT Problem List Decreased strength;Decreased activity tolerance;Decreased balance;Decreased mobility       PT Treatment Interventions Gait training;Stair training;Functional mobility training;Therapeutic activities;Therapeutic exercise;Patient/family education;Neuromuscular re-education    PT Goals (Current goals can be found in the Care Plan section)  Acute Rehab PT Goals Patient Stated Goal: return home with family to assist PT Goal Formulation: With patient Time For Goal Achievement: 09/11/19 Potential to Achieve Goals: Good    Frequency Min 4X/week   Barriers to discharge        Co-evaluation               AM-PAC PT "6 Clicks" Mobility  Outcome Measure Help needed turning from your back to your side while in a flat bed without using bedrails?: None Help needed moving from lying on your back to sitting on  the side of a flat bed without using bedrails?: A Little Help needed moving to and from a bed to a chair (including a wheelchair)?: A Little Help needed standing up from a chair using your arms (e.g., wheelchair or bedside chair)?: A Little Help needed to walk in hospital room?: A Little Help needed climbing 3-5 steps with a railing? : A Lot 6 Click Score: 18    End of Session   Activity Tolerance: Patient tolerated treatment well;Patient limited by fatigue Patient left: in chair;with call bell/phone within reach Nurse Communication: Mobility status PT Visit Diagnosis: Unsteadiness on feet (R26.81);Other abnormalities of gait and mobility (R26.89);Muscle weakness (generalized) (M62.81)    Time: 5361-4431 PT Time Calculation (min) (ACUTE ONLY): 25 min   Charges:   PT Evaluation $PT Eval Moderate Complexity: 1 Mod PT Treatments $Therapeutic Activity: 23-37 mins        2:19 PM, 08/28/19 Lonell Grandchild, MPT Physical Therapist with [facility] [phone] office [phone] mobile phone

## 2019-08-28 NOTE — Care Management Obs Status (Signed)
MEDICARE OBSERVATION STATUS NOTIFICATION   Patient Details  Name: William Sharp MRN: [id number] Date of Birth: 11-24-1940   Medicare Observation Status Notification Given:  Yes    Corey Harold 08/28/2019, 2:37 PM

## 2019-08-28 NOTE — Plan of Care (Signed)
  Problem: Acute Rehab PT Goals(only PT should resolve) Goal: Pt Will Go Supine/Side To Sit Outcome: Progressing Flowsheets (Taken 08/28/2019 1421) Pt will go Supine/Side to Sit:  with modified independence  with supervision Goal: Patient Will Transfer Sit To/From Stand Outcome: Progressing Flowsheets (Taken 08/28/2019 1421) Patient will transfer sit to/from stand: with supervision Goal: Pt Will Transfer Bed To Chair/Chair To Bed Outcome: Progressing Flowsheets (Taken 08/28/2019 1421) Pt will Transfer Bed to Chair/Chair to Bed: with supervision Goal: Pt Will Ambulate Outcome: Progressing Flowsheets (Taken 08/28/2019 1421) Pt will Ambulate:  50 feet  with supervision  with min guard assist  with rolling walker   2:21 PM, 08/28/19 Ocie Bob, MPT Physical Therapist with [facility] [phone] office [phone] mobile phone

## 2019-08-28 NOTE — Progress Notes (Signed)
SLP Cancellation Note  Patient Details Name: William Sharp MRN: [id number] DOB: 01/30/1941   Cancelled treatment:       Reason Eval/Treat Not Completed: SLP screened, no needs identified, will sign off. SLP screened Pt in room. Pt denies any changes in swallowing, speech, language, or cognition. Family is not present at this time, however Pt reports that his wife handles cooking, finances, cleaning, and driving. 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 He was oriented to "almost May". He enjoys watching TV at home.  SLE will be deferred at this time. Reconsult if indicated. SLP will sign off.   Thank you,  Havery Moros, CCC-SLP [phone]   Tatsuya Okray 08/28/2019, 3:36 PM

## 2019-08-28 NOTE — Consult Note (Signed)
[facility] Nollan Muldrow A. Gerilyn Pilgrim, MD     [email or web address]          William Sharp is an 79 y.o. male.   ASSESSMENT/PLAN: ACUTE WORD-FINDING PROBLEM/APHASIA DUE TO ACUTE LEFT HEMISPHERIC WATERSHED INFARCT: Risk factors includes diabetes mellitus, age, dyslipidemia and previous TIA/previous stroke. Dual antiplatelet agents are recommended for 1 month. Subsequently, aspirin 325 is recommended. Cardiac event monitor for 30 days is also recommended to evaluate for possible paroxysmal atrial fibrillation.   This is an 79 year old white male who presents with the acute onset of word-finding difficulties and other speech impairment along with gait instability. The patient was evaluated for possible TIA but was felt to be outside the time limit given the unclear time of onset. It appears that the patient course was stuttering with the patient having about 2 or 3 episodes. Patient does not report chest pain or palpitations. No clear reports of focal numbness or weakness or dizziness. He complains of some neck pain and headache which apparently are chronic. The review of systems otherwise negative.    GENERAL: This is a pleasant male who is doing well at this time.  HEENT: Neck is supple no trauma appreciated.  ABDOMEN: Soft  EXTREMITIES: No edema   BACK: Normal alignment.  SKIN: Normal by inspection.    MENTAL STATUS: Alert and oriented - mostly although he thinks this may. He is oriented to his age and be in the hospital. He however can not give a clear reason why he is in the hospital. Speech, language and cognition are generally intact. Judgment and insight normal.   CRANIAL NERVES: Pupils are equal, round and reactive to light and accommodation; extraocular movements are full, there is no significant nystagmus; upper and lower facial muscles are normal in strength and symmetric, there is no flattening of the nasolabial folds; tongue is midline; uvula is midline; shoulder  elevation is normal.  MOTOR: Normal tone, bulk and strength; no pronator drift.  COORDINATION: Left finger to nose is normal, right finger to nose is normal, No rest tremor; no intention tremor; no postural tremor; no bradykinesia.  REFLEXES: Deep tendon reflexes are symmetrical and normal.   SENSATION: Normal to light touch and temperature.    Blood pressure 133/75, pulse 82, temperature 98.1 F (36.7 C), resp. rate 17, height 6' (1.829 m), weight 108.9 kg, SpO2 97 %.  Past Medical History:  Diagnosis Date  . CAD (coronary artery disease)   . Diabetes mellitus   . Hyperlipemia   . Hypothyroidism   . Obesity   . TIA (transient ischemic attack)     Past Surgical History:  Procedure Laterality Date  . AMPUTATION     Right second toe/nonhealing ulcer  . CIRCUMCISION  04/29/2014  . CORONARY ARTERY BYPASS GRAFT     2 vessel 2000, [facility]  . TRACHEOSTOMY     Age 30    Family History  Problem Relation Age of Onset  . Cancer Father        Lung  . Cancer Mother        Breast  . Cancer Brother        Lung    Social History:  reports that he quit smoking about 28 years ago. He has never used smokeless tobacco. He reports that he does not drink alcohol or use drugs.  Allergies: No Known Allergies  Medications: Prior to Admission medications   Medication Sig Start Date End Date Taking? Authorizing Provider  LANTUS SOLOSTAR 100 UNIT/ML Solostar  Pen Inject 40 Units into the skin daily.  08/11/19  Yes [provider]  metFORMIN (GLUCOPHAGE) 500 MG tablet Take 500 mg by mouth 2 (two) times daily with a meal.   Yes [provider]  nitroGLYCERIN (NITROSTAT) 0.4 MG SL tablet Place 1 tablet (0.4 mg total) under the tongue every 5 (five) minutes as needed. 04/26/15  Yes Mechele ClaudeStacks, Warren, MD    Scheduled Meds: . aspirin  300 mg Rectal Daily   Or  . aspirin  325 mg Oral Daily  . insulin aspart  0-15 Units Subcutaneous TID WC  . insulin aspart  0-5 Units  Subcutaneous QHS  . insulin aspart  10 Units Subcutaneous TID WC  . insulin glargine  40 Units Subcutaneous Daily  . LORazepam  0.5 mg Oral Once  . rosuvastatin  10 mg Oral QPM   Continuous Infusions: PRN Meds:.nitroGLYCERIN     Results for orders placed or performed during the hospital encounter of 08/27/19 (from the past 48 hour(s))  CBG monitoring, ED     Status: Abnormal   Collection Time: 08/27/19  4:25 PM  Result Value Ref Range   Glucose-Capillary 378 (H) 70 - 99 mg/dL    Comment: Glucose reference range applies only to samples taken after fasting for at least 8 hours.  Ethanol     Status: None   Collection Time: 08/27/19  4:58 PM  Result Value Ref Range   Alcohol, Ethyl (B) <10 <10 mg/dL    Comment: (NOTE) Lowest detectable limit for serum alcohol is 10 mg/dL. For medical purposes only. Performed at [facility], [address] [place], [place] [address]   Protime-INR     Status: None   Collection Time: 08/27/19  4:58 PM  Result Value Ref Range   Prothrombin Time 12.9 11.4 - 15.2 seconds   INR 1.0 0.8 - 1.2    Comment: (NOTE) INR goal varies based on device and disease states. Performed at [facility], [address] [place], [place] [address]   APTT     Status: None   Collection Time: 08/27/19  4:58 PM  Result Value Ref Range   aPTT 30 24 - 36 seconds    Comment: Performed at [facility], [address] [place], [place] [address]  CBC     Status: None   Collection Time: 08/27/19  4:58 PM  Result Value Ref Range   WBC 5.7 4.0 - 10.5 K/uL   RBC 4.46 4.22 - 5.81 MIL/uL   Hemoglobin 13.4 13.0 - 17.0 g/dL   HCT 21.341.1 08.639.0 - 57.852.0 %   MCV 92.2 80.0 - 100.0 fL   MCH 30.0 26.0 - 34.0 pg   MCHC 32.6 30.0 - 36.0 g/dL   RDW 46.913.1 62.911.5 - 52.815.5 %   Platelets 254 150 - 400 K/uL   nRBC 0.0 0.0 - 0.2 %    Comment: Performed at [facility], [address] [place], [place] [address]  Differential     Status: None   Collection Time: 08/27/19  4:58 PM  Result Value Ref  Range   Neutrophils Relative % 75 %   Neutro Abs 4.3 1.7 - 7.7 K/uL   Lymphocytes Relative 17 %   Lymphs Abs 0.9 0.7 - 4.0 K/uL   Monocytes Relative 6 %   Monocytes Absolute 0.3 0.1 - 1.0 K/uL   Eosinophils Relative 1 %   Eosinophils Absolute 0.1 0.0 - 0.5 K/uL   Basophils Relative 1 %   Basophils Absolute 0.0 0.0 -  0.1 K/uL   Immature Granulocytes 0 %   Abs Immature Granulocytes 0.02 0.00 - 0.07 K/uL    Comment: Performed at [facility], [address] [place], [place] [address]  Comprehensive metabolic panel     Status: Abnormal   Collection Time: 08/27/19  4:58 PM  Result Value Ref Range   Sodium 136 135 - 145 mmol/L   Potassium 4.5 3.5 - 5.1 mmol/L   Chloride 103 98 - 111 mmol/L   CO2 25 22 - 32 mmol/L   Glucose, Bld 397 (H) 70 - 99 mg/dL    Comment: Glucose reference range applies only to samples taken after fasting for at least 8 hours.   BUN 25 (H) 8 - 23 mg/dL   Creatinine, Ser 0.93 0.61 - 1.24 mg/dL   Calcium 9.0 8.9 - 81.8 mg/dL   Total Protein 6.4 (L) 6.5 - 8.1 g/dL   Albumin 3.6 3.5 - 5.0 g/dL   AST 12 (L) 15 - 41 U/L   ALT 12 0 - 44 U/L   Alkaline Phosphatase 54 38 - 126 U/L   Total Bilirubin 0.8 0.3 - 1.2 mg/dL   GFR calc non Af Amer 58 (L) >60 mL/min   GFR calc Af Amer >60 >60 mL/min   Anion gap 8 5 - 15    Comment: Performed at [facility], [address] [place], [place] [address]  Respiratory Panel by RT PCR (Flu A&B, Covid) - Nasopharyngeal Swab     Status: None   Collection Time: 08/27/19  6:27 PM   Specimen: Nasopharyngeal Swab  Result Value Ref Range   SARS Coronavirus 2 by RT PCR NEGATIVE NEGATIVE    Comment: (NOTE) SARS-CoV-2 target nucleic acids are NOT DETECTED. The SARS-CoV-2 RNA is generally detectable in upper respiratoy specimens during the acute phase of infection. The lowest concentration of SARS-CoV-2 viral copies this assay can detect is 131 copies/mL. A negative result does not preclude SARS-Cov-2 infection and should not be used as  the sole basis for treatment or other patient management decisions. A negative result may occur with  improper specimen collection/handling, submission of specimen other than nasopharyngeal swab, presence of viral mutation(s) within the areas targeted by this assay, and inadequate number of viral copies (<131 copies/mL). A negative result must be combined with clinical observations, patient history, and epidemiological information. The expected result is Negative. Fact Sheet for Patients:  https://www.moore.com/ Fact Sheet for Healthcare Providers:  https://www.young.biz/ This test is not yet ap proved or cleared by the [place] FDA and  has been authorized for detection and/or diagnosis of SARS-CoV-2 by FDA under an Emergency Use Authorization (EUA). This EUA will remain  in effect (meaning this test can be used) for the duration of the COVID-19 declaration under Section 564(b)(1) of the Act, 21 U.S.C. section 360bbb-3(b)(1), unless the authorization is terminated or revoked sooner.    Influenza A by PCR NEGATIVE NEGATIVE   Influenza B by PCR NEGATIVE NEGATIVE    Comment: (NOTE) The Xpert Xpress SARS-CoV-2/FLU/RSV assay is intended as an aid in  the diagnosis of influenza from Nasopharyngeal swab specimens and  should not be used as a sole basis for treatment. Nasal washings and  aspirates are unacceptable for Xpert Xpress SARS-CoV-2/FLU/RSV  testing. Fact Sheet for Patients: https://www.moore.com/ Fact Sheet for Healthcare Providers: https://www.young.biz/ This test is not yet approved or cleared by the [place] FDA and  has been authorized for detection and/or diagnosis of SARS-CoV-2 by  FDA under an Emergency Use  Authorization (EUA). This EUA will remain  in effect (meaning this test can be used) for the duration of the  Covid-19 declaration under Section 564(b)(1) of the Act, 21  U.S.C.  section 360bbb-3(b)(1), unless the authorization is  terminated or revoked. Performed at [facility], [address] [place], [place] [address]   CBG monitoring, ED     Status: Abnormal   Collection Time: 08/27/19  7:14 PM  Result Value Ref Range   Glucose-Capillary 224 (H) 70 - 99 mg/dL    Comment: Glucose reference range applies only to samples taken after fasting for at least 8 hours.  Glucose, capillary     Status: Abnormal   Collection Time: 08/27/19  8:40 PM  Result Value Ref Range   Glucose-Capillary 156 (H) 70 - 99 mg/dL    Comment: Glucose reference range applies only to samples taken after fasting for at least 8 hours.   Comment 1 Notify RN    Comment 2 Document in Chart   Urine rapid drug screen (hosp performed)     Status: None   Collection Time: 08/27/19 10:00 PM  Result Value Ref Range   Opiates NONE DETECTED NONE DETECTED   Cocaine NONE DETECTED NONE DETECTED   Benzodiazepines NONE DETECTED NONE DETECTED   Amphetamines NONE DETECTED NONE DETECTED   Tetrahydrocannabinol NONE DETECTED NONE DETECTED   Barbiturates NONE DETECTED NONE DETECTED    Comment: (NOTE) DRUG SCREEN FOR MEDICAL PURPOSES ONLY.  IF CONFIRMATION IS NEEDED FOR ANY PURPOSE, NOTIFY LAB WITHIN 5 DAYS. LOWEST DETECTABLE LIMITS FOR URINE DRUG SCREEN Drug Class                     Cutoff (ng/mL) Amphetamine and metabolites    1000 Barbiturate and metabolites    200 Benzodiazepine                 200 Tricyclics and metabolites     300 Opiates and metabolites        300 Cocaine and metabolites        300 THC                            50 Performed at [facility], [address] [place], [place] [address]   Urinalysis, Routine w reflex microscopic     Status: Abnormal   Collection Time: 08/27/19 10:00 PM  Result Value Ref Range   Color, Urine YELLOW YELLOW   APPearance CLEAR CLEAR   Specific Gravity, Urine 1.022 1.005 - 1.030   pH 5.0 5.0 - 8.0   Glucose, UA >=500 (A) NEGATIVE mg/dL   Hgb  urine dipstick NEGATIVE NEGATIVE   Bilirubin Urine NEGATIVE NEGATIVE   Ketones, ur 5 (A) NEGATIVE mg/dL   Protein, ur NEGATIVE NEGATIVE mg/dL   Nitrite NEGATIVE NEGATIVE   Leukocytes,Ua NEGATIVE NEGATIVE   RBC / HPF 0-5 0 - 5 RBC/hpf   WBC, UA 0-5 0 - 5 WBC/hpf   Bacteria, UA RARE (A) NONE SEEN   Mucus PRESENT     Comment: Performed at [facility], [address] [place], [place] [address]  Hemoglobin A1c     Status: Abnormal   Collection Time: 08/28/19  5:34 AM  Result Value Ref Range   Hgb A1c MFr Bld 11.9 (H) 4.8 - 5.6 %    Comment: (NOTE) Pre diabetes:          5.7%-6.4% Diabetes:              >  6.4% Glycemic control for   <7.0% adults with diabetes    Mean Plasma Glucose 294.83 mg/dL    Comment: Performed at [facility], [address] [place], [place] [address]  Lipid panel     Status: Abnormal   Collection Time: 08/28/19  5:34 AM  Result Value Ref Range   Cholesterol 183 0 - 200 mg/dL   Triglycerides 191 <478 mg/dL   HDL 27 (L) >29 mg/dL   Total CHOL/HDL Ratio 6.8 RATIO   VLDL 24 0 - 40 mg/dL   LDL Cholesterol 562 (H) 0 - 99 mg/dL    Comment:        Total Cholesterol/HDL:CHD Risk Coronary Heart Disease Risk Table                     Men   Women  1/2 Average Risk   3.4   3.3  Average Risk       5.0   4.4  2 X Average Risk   9.6   7.1  3 X Average Risk  23.4   11.0        Use the calculated Patient Ratio above and the CHD Risk Table to determine the patient's CHD Risk.        ATP III CLASSIFICATION (LDL):  <100     mg/dL   Optimal  130-865  mg/dL   Near or Above                    Optimal  130-159  mg/dL   Borderline  784-696  mg/dL   High  >295     mg/dL   Very High Performed at [facility], [address] [place], [place] [address]   Glucose, capillary     Status: Abnormal   Collection Time: 08/28/19  7:24 AM  Result Value Ref Range   Glucose-Capillary 185 (H) 70 - 99 mg/dL    Comment: Glucose reference range applies only to samples taken  after fasting for at least 8 hours.  Glucose, capillary     Status: Abnormal   Collection Time: 08/28/19 11:28 AM  Result Value Ref Range   Glucose-Capillary 256 (H) 70 - 99 mg/dL    Comment: Glucose reference range applies only to samples taken after fasting for at least 8 hours.  Glucose, capillary     Status: None   Collection Time: 08/28/19  4:17 PM  Result Value Ref Range   Glucose-Capillary 77 70 - 99 mg/dL    Comment: Glucose reference range applies only to samples taken after fasting for at least 8 hours.    Studies/Results:  BRAIN MRI MRA FINDINGS: MRI HEAD  Brain: There are small foci of restricted diffusion in the parasagittal left frontoparietal lobes.  Chronic infarcts are present the right occipitotemporal lobes, right occipital lobe abutting the parieto-occipital sulcus, and left superior parietal lobule. Chronic small vessel infarcts the basal ganglia bilaterally and right thalamus. There are chronic blood products associated with the right occipitotemporal infarct.  There is prominence of the ventricles and sulci reflecting generalized parenchymal volume loss with superimposed ex vacuo ventricular dilatation adjacent to infarcts. Additional patchy T2 hyperintensity in the supratentorial white matter is nonspecific but probably reflects mild chronic microvascular ischemic changes. There is no hydrocephalus or extra-axial fluid collection.  Vascular: Major vessel flow voids at the skull base are preserved.  Skull and upper cervical spine: Normal marrow signal is preserved.  Sinuses/Orbits: Minor mucosal thickening. No significant orbital  finding.  Other: Sella is unremarkable.  Mastoid air cells are clear.  MRA HEAD  Intracranial internal carotid arteries are patent with atherosclerotic irregularity. Middle cerebral arteries are patent with mild distal stenosis. Proximal anterior cerebral arteries are patent. There is high-grade stenosis of the  precallosal left ACA. Additional high-grade proximal supracallosal right ACA stenosis.  Intracranial vertebral arteries, basilar artery, posterior cerebral arteries are patent.  There is no aneurysm.  IMPRESSION: Few small acute infarcts in the left ACA territory. High-grade stenosis of the precallosal left ACA.  Chronic infarcts and additional intracranial atherosclerosis detailed above.     THE BRAIN MRI AND MRA REVIEWED IN PERSON there are about 5 small increased signal seen on DWI involving the left frontal lobe. The largest 1 is at the interhemispheric fissure area but the rest are deeper in the parasagittal watershed distribution. There is the remote area of reduced signal and encephalomalacia seen on she 1 and bright on T2 involving the left occipital parietal watershed area. This is a large cortical infarct that is remote. There is also area of encephalomalacia and associated gliosis involving the centrum semiovale adjacent to the frontal horn of the lateral ventricle on the right side. 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 No hemorrhages appreciated. No large vessel occlusive disease is noted.    Ily Denno A. Merlene Laughter, M.D.  Diplomate, Tax adviser of Psychiatry and Neurology ( Neurology). 08/28/2019, 8:07 PM

## 2019-08-28 NOTE — Progress Notes (Signed)
PROGRESS NOTE [facility]   William Sharp  ZOX:[id number]  DOB: 08/11/1940  DOA: 08/27/2019 PCP: Patient, No Pcp Per   Brief Admission Hx: 79 y.o. male with medical history significant of CAD, type 2 diabetes, diabetic peripheral neuropathy, hyperlipidemia, hypothyroidism, obesity, TIA who is brought to the emergency department via EMS after stopping to talk to his wife while he was in the parking lot ready to go to the pharmacy to pick up his insulin.  MDM/Assessment & Plan:   1. Acute CVA - full stroke work up in progress.  PT recommending SNF.  I was unable to reach any family members today by telephone call to spouse and daughters.  Neurology consult requested.  Follow up echocardiogram, carotid dopplers, trying to better control diabetes.  Added crestor 10 mg daily for uncontrolled LDL.   Further recommendations to follow. Continue full dose aspirin for antiplatelet therapy.  2. CAD - stable, will need follow up with Dr. Antoine Poche after discharge.  No chest pain symptoms. Crestor added for uncontrolled lipids.  3. HTN - permissive hypertension allowed.  4. Uncontrolled type 2 DM as evidenced by A1c of 11.2%.- titrating insulin doses for better glycemic control.    DVT prophylaxis: SCD Code Status: Full  Family Communication: unable to reach by telephone today, multiple calls unanswered Disposition Plan: continue full stroke work up, neurology consultation pending, TOC consult for SNF placement   Consultants:  neurology  Procedures:    Antimicrobials:     Subjective: Pt reports no specific complaints today.    Objective: Vitals:   08/28/19 0103 08/28/19 0300 08/28/19 0500 08/28/19 0652  BP: (!) 135/92 (!) 130/58 (!) 136/52 127/74  Pulse: 64 62 60 (!) 59  Resp: Temp: 97.8 F (36.6 C) 97.9 F (36.6 C) 98.4 F (36.9 C) 98.9 F (37.2 C)  TempSrc: Oral Oral Oral Oral  SpO2: 100% 98% 98% 98%  Weight:      Height:        Intake/Output  Summary (Last 24 hours) at 08/28/2019 1428 Last data filed at 08/28/2019 0900 Gross per 24 hour  Intake 480 ml  Output --  Net 480 ml   Filed Weights   08/27/19 1626  Weight: 108.9 kg     REVIEW OF SYSTEMS  As per history otherwise all reviewed and reported negative  Exam:  General exam: elderly weak male, sitting up in bed, no apparent distress.  Respiratory system: Clear. No increased work of breathing. Cardiovascular system: S1 & S2 heard. No JVD, murmurs, gallops, clicks or pedal edema. Gastrointestinal system: Abdomen is nondistended, soft and nontender. Normal bowel sounds heard. Central nervous system: Alert and oriented to person only. No focal neurological deficits. Extremities: no CCE.  Data Reviewed: Basic Metabolic Panel: Recent Labs  Lab 08/27/19 1658  NA 136  K 4.5  CL 103  CO2 25  GLUCOSE 397*  BUN 25*  CREATININE 1.18  CALCIUM 9.0   Liver Function Tests: Recent Labs  Lab 08/27/19 1658  AST 12*  ALT 12  ALKPHOS 54  BILITOT 0.8  PROT 6.4*  ALBUMIN 3.6   No results for input(s): LIPASE, AMYLASE in the last 168 hours. No results for input(s): AMMONIA in the last 168 hours. CBC: Recent Labs  Lab 08/27/19 1658  WBC 5.7  NEUTROABS 4.3  HGB 13.4  HCT 41.1  MCV 92.2  PLT 254   Cardiac Enzymes: No results for input(s): CKTOTAL, CKMB, CKMBINDEX, TROPONINI in the last 168 hours.  CBG (last 3)  Recent Labs    08/27/19 1914 08/28/19 0724 08/28/19 1128  GLUCAP 224* 185* 256*   Recent Results (from the past 240 hour(s))  Respiratory Panel by RT PCR (Flu A&B, Covid) - Nasopharyngeal Swab     Status: None   Collection Time: 08/27/19  6:27 PM   Specimen: Nasopharyngeal Swab  Result Value Ref Range Status   SARS Coronavirus 2 by RT PCR NEGATIVE NEGATIVE Final    Comment: (NOTE) SARS-CoV-2 target nucleic acids are NOT DETECTED. The SARS-CoV-2 RNA is generally detectable in upper respiratoy specimens during the acute phase of infection. The  lowest concentration of SARS-CoV-2 viral copies this assay can detect is 131 copies/mL. A negative result does not preclude SARS-Cov-2 infection and should not be used as the sole basis for treatment or other patient management decisions. A negative result may occur with  improper specimen collection/handling, submission of specimen other than nasopharyngeal swab, presence of viral mutation(s) within the areas targeted by this assay, and inadequate number of viral copies (<131 copies/mL). A negative result must be combined with clinical observations, patient history, and epidemiological information. The expected result is Negative. Fact Sheet for Patients:  https://www.moore.com/https://www.fda.gov/media/142436/download Fact Sheet for Healthcare Providers:  https://www.young.biz/https://www.fda.gov/media/142435/download This test is not yet ap proved or cleared by the [place] FDA and  has been authorized for detection and/or diagnosis of SARS-CoV-2 by FDA under an Emergency Use Authorization (EUA). This EUA will remain  in effect (meaning this test can be used) for the duration of the COVID-19 declaration under Section 564(b)(1) of the Act, 21 U.S.C. section 360bbb-3(b)(1), unless the authorization is terminated or revoked sooner.    Influenza A by PCR NEGATIVE NEGATIVE Final   Influenza B by PCR NEGATIVE NEGATIVE Final    Comment: (NOTE) The Xpert Xpress SARS-CoV-2/FLU/RSV assay is intended as an aid in  the diagnosis of influenza from Nasopharyngeal swab specimens and  should not be used as a sole basis for treatment. Nasal washings and  aspirates are unacceptable for Xpert Xpress SARS-CoV-2/FLU/RSV  testing. Fact Sheet for Patients: https://www.moore.com/https://www.fda.gov/media/142436/download Fact Sheet for Healthcare Providers: https://www.young.biz/https://www.fda.gov/media/142435/download This test is not yet approved or cleared by the [place] FDA and  has been authorized for detection and/or diagnosis of SARS-CoV-2 by  FDA under an Emergency  Use Authorization (EUA). This EUA will remain  in effect (meaning this test can be used) for the duration of the  Covid-19 declaration under Section 564(b)(1) of the Act, 21  U.S.C. section 360bbb-3(b)(1), unless the authorization is  terminated or revoked. Performed at [facility], [address] [place], [place] [id number]      Studies: CT HEAD WO CONTRAST  Result Date: 08/27/2019 CLINICAL DATA:  TIA EXAM: CT HEAD WITHOUT CONTRAST TECHNIQUE: Contiguous axial images were obtained from the base of the skull through the vertex without intravenous contrast. COMPARISON:  CT head January 03, 2018 FINDINGS: Brain: Remote right posterior temporal and left parietal infarcts are unchanged from comparison exam. Few remote appearing lacunar type infarcts are noted in the bilateral basal ganglia. No evidence of acute infarction, hemorrhage, hydrocephalus, extra-axial collection or mass lesion/mass effect. Symmetric prominence of the ventricles, cisterns and sulci compatible with parenchymal volume loss. Patchy areas of white matter hypoattenuation are most compatible with chronic microvascular angiopathy. Faint senescent mineralization of the basal ganglia is noted. Vascular: Atherosclerotic calcification of the carotid siphons and intradural vertebral arteries. No hyperdense vessel. Skull: Chronic soft tissue thickening of the posterior left high parietal scalp may reflect  an area of scarring. No calvarial fracture or suspicious osseous lesion. No scalp swelling or hematoma. Sinuses/Orbits: Minimal mural thickening in the ethmoid sinuses. Remaining paranasal sinuses and mastoid air cells are predominantly clear. Small amount of debris in the right external auditory canal may reflect cerumen. Orbital structures are unremarkable aside from prior lens extractions. Other: None IMPRESSION: 1. No acute intracranial abnormality. 2. Remote right posterior temporal and left parietal infarcts. Chronic appearing a basal  ganglia lacunar infarcts as well. 3. Chronic microvascular angiopathy and parenchymal volume loss. Electronically Signed   By: Lovena Le M.D.   On: 08/27/2019 17:37   MR ANGIO HEAD WO CONTRAST  Result Date: 08/28/2019 CLINICAL DATA:  Abnormal speech EXAM: MRI HEAD WITHOUT CONTRAST MRA HEAD WITHOUT CONTRAST TECHNIQUE: Multiplanar, multiecho pulse sequences of the brain and surrounding structures were obtained without intravenous contrast. Angiographic images of the head were obtained using MRA technique without contrast. COMPARISON:  None. FINDINGS: MRI HEAD Brain: There are small foci of restricted diffusion in the parasagittal left frontoparietal lobes. Chronic infarcts are present the right occipitotemporal lobes, right occipital lobe abutting the parieto-occipital sulcus, and left superior parietal lobule. Chronic small vessel infarcts the basal ganglia bilaterally and right thalamus. There are chronic blood products associated with the right occipitotemporal infarct. There is prominence of the ventricles and sulci reflecting generalized parenchymal volume loss with superimposed ex vacuo ventricular dilatation adjacent to infarcts. Additional patchy T2 hyperintensity in the supratentorial white matter is nonspecific but probably reflects mild chronic microvascular ischemic changes. There is no hydrocephalus or extra-axial fluid collection. Vascular: Major vessel flow voids at the skull base are preserved. Skull and upper cervical spine: Normal marrow signal is preserved. Sinuses/Orbits: Minor mucosal thickening. No significant orbital finding. Other: Sella is unremarkable.  Mastoid air cells are clear. MRA HEAD Intracranial internal carotid arteries are patent with atherosclerotic irregularity. Middle cerebral arteries are patent with mild distal stenosis. Proximal anterior cerebral arteries are patent. There is high-grade stenosis of the precallosal left ACA. Additional high-grade proximal supracallosal  right ACA stenosis. Intracranial vertebral arteries, basilar artery, posterior cerebral arteries are patent. There is no aneurysm. IMPRESSION: Few small acute infarcts in the left ACA territory. High-grade stenosis of the precallosal left ACA. Chronic infarcts and additional intracranial atherosclerosis detailed above. Electronically Signed   By: Macy Mis M.D.   On: 08/28/2019 12:00   MR BRAIN WO CONTRAST  Result Date: 08/28/2019 CLINICAL DATA:  Abnormal speech EXAM: MRI HEAD WITHOUT CONTRAST MRA HEAD WITHOUT CONTRAST TECHNIQUE: Multiplanar, multiecho pulse sequences of the brain and surrounding structures were obtained without intravenous contrast. Angiographic images of the head were obtained using MRA technique without contrast. COMPARISON:  None. FINDINGS: MRI HEAD Brain: There are small foci of restricted diffusion in the parasagittal left frontoparietal lobes. Chronic infarcts are present the right occipitotemporal lobes, right occipital lobe abutting the parieto-occipital sulcus, and left superior parietal lobule. Chronic small vessel infarcts the basal ganglia bilaterally and right thalamus. There are chronic blood products associated with the right occipitotemporal infarct. There is prominence of the ventricles and sulci reflecting generalized parenchymal volume loss with superimposed ex vacuo ventricular dilatation adjacent to infarcts. Additional patchy T2 hyperintensity in the supratentorial white matter is nonspecific but probably reflects mild chronic microvascular ischemic changes. There is no hydrocephalus or extra-axial fluid collection. Vascular: Major vessel flow voids at the skull base are preserved. Skull and upper cervical spine: Normal marrow signal is preserved. Sinuses/Orbits: Minor mucosal thickening. No significant orbital finding. Other: Sella is unremarkable.  Mastoid air cells are clear. MRA HEAD Intracranial internal carotid arteries are patent with atherosclerotic  irregularity. Middle cerebral arteries are patent with mild distal stenosis. Proximal anterior cerebral arteries are patent. There is high-grade stenosis of the precallosal left ACA. Additional high-grade proximal supracallosal right ACA stenosis. Intracranial vertebral arteries, basilar artery, posterior cerebral arteries are patent. There is no aneurysm. IMPRESSION: Few small acute infarcts in the left ACA territory. High-grade stenosis of the precallosal left ACA. Chronic infarcts and additional intracranial atherosclerosis detailed above. Electronically Signed   By: Guadlupe Spanish M.D.   On: 08/28/2019 12:00   US Carotid Bilateral (at [facility] and [facility] only)  Result Date: 08/28/2019 CLINICAL DATA:  79 year old male with a history of TIA EXAM: BILATERAL CAROTID DUPLEX ULTRASOUND TECHNIQUE: Wallace Cullens scale imaging, color Doppler and duplex ultrasound were performed of bilateral carotid and vertebral arteries in the neck. COMPARISON:  None. FINDINGS: Criteria: Quantification of carotid stenosis is based on velocity parameters that correlate the residual internal carotid diameter with NASCET-based stenosis levels, using the diameter of the distal internal carotid lumen as the denominator for stenosis measurement. The following velocity measurements were obtained: RIGHT ICA:  Systolic 77 cm/sec, Diastolic 17 cm/sec CCA:  80 cm/sec SYSTOLIC ICA/CCA RATIO:  0.96 ECA:  219 cm/sec LEFT ICA:  Systolic 75 cm/sec, Diastolic 19 cm/sec CCA:  107 cm/sec SYSTOLIC ICA/CCA RATIO:  0.7 ECA:  174 cm/sec Right Brachial SBP: Not acquired Left Brachial SBP: Not acquired RIGHT CAROTID ARTERY: No significant calcifications of the right common carotid artery. Intermediate waveform maintained. Moderate heterogeneous and partially calcified plaque at the right carotid bifurcation. No significant lumen shadowing. Low resistance waveform of the right ICA. No significant tortuosity. RIGHT VERTEBRAL ARTERY: Antegrade flow with low resistance  waveform. LEFT CAROTID ARTERY: No significant calcifications of the left common carotid artery. Intermediate waveform maintained. Moderate heterogeneous and partially calcified plaque at the left carotid bifurcation. No significant lumen shadowing. Low resistance waveform of the left ICA. No significant tortuosity. LEFT VERTEBRAL ARTERY:  Antegrade flow with low resistance waveform. IMPRESSION: Color duplex indicates moderate heterogeneous and calcified plaque, with no hemodynamically significant stenosis by duplex criteria in the extracranial cerebrovascular circulation. Signed, Yvone Neu. Reyne Dumas, RPVI [facility] Electronically Signed   By: Gilmer Mor D.O.   On: 08/28/2019 13:43   DG CHEST PORT 1 VIEW  Result Date: 08/27/2019 CLINICAL DATA:  TIA EXAM: PORTABLE CHEST 1 VIEW COMPARISON:  01/03/2018 FINDINGS: Post sternotomy changes. Minimal scarring or atelectasis left base. No consolidation, pleural effusion, or pneumothorax. Mild cardiomegaly with aortic atherosclerosis. Retrocardiac opacity possibly due to hiatal hernia. IMPRESSION: 1. Cardiomegaly without edema or focal airspace disease 2. Possible hiatal hernia Electronically Signed   By: Jasmine Pang M.D.   On: 08/27/2019 19:45   ECHOCARDIOGRAM COMPLETE  Result Date: 08/28/2019    ECHOCARDIOGRAM REPORT   Patient Name:   William Sharp Date of Exam: 08/28/2019 Medical Rec #:  [id number]          Height:       72.0 in Accession #:    [id number]         Weight:       240.0 lb Date of Birth:  Nov 27, 1940         BSA:          2.302 m Patient Age:    78 years           BP:  127/74 mmHg Patient Gender: M                  HR:           9 bpm. [facility] Procedure: 2D Echo Indications:    TIA 435.9 / G45.9  History:        Patient has no prior history of Echocardiogram examinations.                 CAD, TIA; Risk Factors:Hypertension, Dyslipidemia, Diabetes and                  Former Smoker.  Sonographer:    Jeryl Columbia RDCS (AE) Referring Phys: [id number] DAVID MANUEL ORTIZ IMPRESSIONS  1. Left ventricular ejection fraction, by estimation, is 40%. The left ventricle has mild to moderately decreased function. The left ventricle demonstrates regional wall motion abnormalities (see scoring diagram/findings for description). The left ventricular internal cavity size was mildly dilated. There is severe left ventricular hypertrophy. Left ventricular diastolic parameters are consistent with Grade I diastolic dysfunction (impaired relaxation).  2. Right ventricular systolic function is normal. The right ventricular size is normal. Tricuspid regurgitation signal is inadequate for assessing PA pressure.  3. Left atrial size was mildly dilated.  4. The mitral valve is grossly normal. Trivial mitral valve regurgitation.  5. The aortic valve is tricuspid. Aortic valve regurgitation is not visualized. Mild to moderate aortic valve sclerosis/calcification is present, without any evidence of aortic stenosis.  6. Aortic dilatation noted. There is mild dilatation of the ascending aorta.  7. The inferior vena cava is normal in size with greater than 50% respiratory variability, suggesting right atrial pressure of 3 mmHg. FINDINGS  Left Ventricle: Left ventricular ejection fraction, by estimation, is 40%. The left ventricle has mild to moderately decreased function. The left ventricle demonstrates regional wall motion abnormalities. The left ventricular internal cavity size was mildly dilated. There is severe left ventricular hypertrophy. Left ventricular diastolic parameters are consistent with Grade I diastolic dysfunction (impaired relaxation).  LV Wall Scoring: The inferior wall is akinetic. Right Ventricle: The right ventricular size is normal. No increase in right ventricular wall thickness. Right ventricular systolic function is normal. Tricuspid regurgitation signal is inadequate for  assessing PA pressure. Left Atrium: Left atrial size was mildly dilated. Right Atrium: Right atrial size was normal in size. Pericardium: There is no evidence of pericardial effusion. Mitral Valve: The mitral valve is grossly normal. Mild mitral annular calcification. Trivial mitral valve regurgitation. Tricuspid Valve: The tricuspid valve is grossly normal. Tricuspid valve regurgitation is trivial. Aortic Valve: The aortic valve is tricuspid. Aortic valve regurgitation is not visualized. Mild to moderate aortic valve sclerosis/calcification is present, without any evidence of aortic stenosis. Moderate aortic valve annular calcification. Pulmonic Valve: The pulmonic valve was grossly normal. Pulmonic valve regurgitation is not visualized. Aorta: Aortic dilatation noted. There is mild dilatation of the ascending aorta. Venous: The inferior vena cava is normal in size with greater than 50% respiratory variability, suggesting right atrial pressure of 3 mmHg. IAS/Shunts: No atrial level shunt detected by color flow Doppler.  LEFT VENTRICLE PLAX 2D LVIDd:         5.68 cm  Diastology LVIDs:         4.80 cm  LV e' lateral:   9.73 cm/s LV PW:         1.65 cm  LV E/e' lateral: 5.5 LV IVS:        1.74 cm  LV  e' medial:    4.12 cm/s LVOT diam:     2.10 cm  LV E/e' medial:  12.9 LVOT Area:     3.46 cm  RIGHT VENTRICLE RV S prime:     8.59 cm/s TAPSE (M-mode): 1.4 cm LEFT ATRIUM           Index LA diam:      4.70 cm 2.04 cm/m LA Vol (A2C): 58.2 ml 25.28 ml/m LA Vol (A4C): 76.7 ml 33.32 ml/m   AORTA Ao Root diam: 3.60 cm MITRAL VALVE MV Area (PHT): 2.57 cm    SHUNTS MV Decel Time: 295 msec    Systemic Diam: 2.10 cm MV E velocity: 53.10 cm/s MV A velocity: 87.80 cm/s MV E/A ratio:  0.60 Nona Dell MD Electronically signed by Nona Dell MD Signature Date/Time: 08/28/2019/10:11:07 AM    Final      Scheduled Meds:  aspirin  300 mg Rectal Daily   Or   aspirin  325 mg Oral Daily   insulin aspart  0-15 Units  Subcutaneous TID WC   insulin aspart  8 Units Subcutaneous TID WC   insulin glargine  40 Units Subcutaneous Daily   rosuvastatin  10 mg Oral QPM   Continuous Infusions:  Principal Problem:   Acute CVA (cerebrovascular accident) (HCC) Active Problems:   CAD (coronary artery disease)   HTN (hypertension), benign   Diabetic neuropathy (HCC)   Hyperlipemia   Obesity   Other specified hypothyroidism   Type 2 diabetes mellitus (HCC)   Time spent:   Standley Dakins, MD [facility] 08/28/2019, 2:28 PM    LOS: 0 days  How to contact the [facility] Attending or Consulting provider 7A - 7P or covering provider during after hours 7P -7A, for this patient?  1. Check the care team in [facility] and look for a) attending/consulting [facility] provider listed and b) the [facility] team listed 2. Log into [email or web address] and use 's universal password to access. If you do not have the password, please contact the hospital operator. 3. Locate the [facility] provider you are looking for under [facility] and page to a number that you can be directly reached. 4. If you still have difficulty reaching the provider, please page the [facility] (Director on Call) for the Hospitalists listed on amion for assistance.

## 2019-08-28 NOTE — Progress Notes (Signed)
2 D echo completed 

## 2019-08-28 NOTE — TOC Initial Note (Signed)
Transition of Care [facility]) - Initial/Assessment Note    Patient Details  Name: William Sharp MRN: [id number] Date of Birth: 02/19/1941  Transition of Care [facility]) CM/SW Contact:    Leitha Bleak, RN Phone Number: 08/28/2019, 4:08 PM  Clinical Narrative:      Patient admitted for TIA, lives at home with his wife. PT is recommending SNF. Patient states he has a walker and wants to go home with home health. Tim with [facility] accepted the referral for HHPT.       Expected Discharge Plan: Home w Home Health Services Barriers to Discharge: Continued Medical Work up   Patient Goals and CMS Choice Patient states their goals for this hospitalization and ongoing recovery are:: to return home. CMS Medicare.gov Compare Post Acute Care list provided to:: Patient Choice offered to / list presented to : Patient  Expected Discharge Plan and Services Expected Discharge Plan: Home w Home Health Services       Living arrangements for the past 2 months: Single Family Home                    HH Arranged: PT HH Agency: [facility] ([facility]) Date HH Agency Contacted: 08/28/19 Time HH Agency Contacted: 1607 Representative spoke with at [facility] Agency: Reinaldo Berber  Prior Living Arrangements/Services Living arrangements for the past 2 months: Single Family Home Lives with:: Spouse   Do you feel safe going back to the place where you live?: No      Need for Family Participation in Patient Care: Yes (Comment) Care giver support system in place?: Yes (comment) Current home services: DME Criminal Activity/Legal Involvement Pertinent to Current Situation/Hospitalization: No - Comment as needed  Activities of Daily Living Home Assistive Devices/Equipment: Environmental consultant (specify type), Cane (specify quad or straight) ADL Screening (condition at time of admission) Patient's cognitive ability adequate to safely complete daily activities?: Yes Is the patient deaf or have difficulty  hearing?: No Does the patient have difficulty seeing, even when wearing glasses/contacts?: No Does the patient have difficulty concentrating, remembering, or making decisions?: No Patient able to express need for assistance with ADLs?: Yes Does the patient have difficulty dressing or bathing?: No Independently performs ADLs?: Yes (appropriate for developmental age) Does the patient have difficulty walking or climbing stairs?: No Weakness of Legs: None Weakness of Arms/Hands: None  Permission Sought/Granted     Emotional Assessment     Affect (typically observed): Accepting, Pleasant Orientation: : Oriented to Self, Oriented to Place, Oriented to  Time Alcohol / Substance Use: Not Applicable Psych Involvement: No (comment)  Admission diagnosis:  TIA (transient ischemic attack) [G45.9] Hyperglycemia [R73.9] Acute CVA (cerebrovascular accident) [facility]) [I63.9] Patient Active Problem List   Diagnosis Date Noted  . Acute CVA (cerebrovascular accident) (HCC) 08/28/2019  . Type 2 diabetes mellitus (HCC)   . Cellulitis and abscess of leg 05/11/2015  . Other specified hypothyroidism 01/21/2015  . Long-term insulin use (HCC) 10/19/2014  . CAD (coronary artery disease)   . HTN (hypertension), benign   . Diabetic neuropathy (HCC)   . Hyperlipemia   . Obesity    PCP:  Patient, No Pcp Per Pharmacy:   [facility] [address], [place] - [address] [place] [address] Phone: [phone] Fax: [phone]  [facility] [place] - [address] [place] [address] Phone: [phone] Fax: [phone]

## 2019-08-28 NOTE — Evaluation (Signed)
Occupational Therapy Evaluation Patient Details Name: William Sharp MRN: [id number] DOB: 18-Dec-1940 Today's Date: 08/28/2019    History of Present Illness William Sharp is a 79 y.o. male with medical history significant of CAD, type 2 diabetes, diabetic peripheral neuropathy, hyperlipidemia, hypothyroidism, obesity, TIA who is brought to the emergency department via EMS after stopping to talk to his wife while he was in the parking lot ready to go to the pharmacy to pick up his insulin.  He denies losing consciousness, but states that this sometimes happens when his glucose is elevated.  By the time the ambulance crew arrived, his symptoms have resolved.  His CBG on the scene was 411 mg/dL.  EMS gave him 500 mL of NS.  He denies headache, blurred vision, polyuria, polydipsia, but complains of recently worsening nocturia.  No fever, chills, rhinorrhea, sore throat, dyspnea, cough, wheezing or hemoptysis.  He denies chest pain, palpitations, dizziness, diaphoresis, PND, orthopnea or pitting edema of the lower extremities.  No abdominal pain, diarrhea, constipation, melena or hematochezia.  No dysuria, frequency or hematuria.   Clinical Impression   Pt alert and agreeable to OT evaluation. Pt with BUE strength WNL, coordination and sensation are intact. Pt performing seated ADLs at baseline with occasional cuing due to cognition, declined to perform standing tasks as he was anxious to eat breakfast. No further OT services required at this time.     Follow Up Recommendations  No OT follow up    Equipment Recommendations  None recommended by OT       Precautions / Restrictions Precautions Precautions: Fall Restrictions Weight Bearing Restrictions: No      Mobility Bed Mobility Overal bed mobility: Modified Independent                Transfers Overall transfer level: Modified independent Equipment used: None Transfers: Lateral/Scoot Transfers          Lateral/Scoot  Transfers: Modified independent (Device/Increase time)          ADL either performed or assessed with clinical judgement   ADL Overall ADL's : Needs assistance/impaired Eating/Feeding: Modified independent;Sitting   Grooming: Set up;Sitting           Upper Body Dressing : Supervision/safety;Sitting                     General ADL Comments: pt performing ADLs at baseline, occasional cuing due to cognition                  Pertinent Vitals/Pain Pain Assessment: No/denies pain     Hand Dominance Right   Extremity/Trunk Assessment Upper Extremity Assessment Upper Extremity Assessment: Overall WFL for tasks assessed   Lower Extremity Assessment Lower Extremity Assessment: Defer to PT evaluation       Communication Communication Communication: No difficulties   Cognition Arousal/Alertness: Awake/alert Behavior During Therapy: WFL for tasks assessed/performed Overall Cognitive Status: History of cognitive impairments - at baseline                                                Home Living Family/patient expects to be discharged to:: Private residence Living Arrangements: Spouse/significant other Available Help at Discharge: Family;Available PRN/intermittently Type of Home: House Home Access: Stairs to enter [employer] of Steps: 2 Entrance Stairs-Rails: (one railing, unsure which side) Home Layout: One level  Bathroom Shower/Tub: Occupational psychologist: Standard     Home Equipment: Environmental consultant - 2 wheels;Walker - 4 wheels;Cane - single point;Shower seat          Prior Functioning/Environment Level of Independence: Independent with assistive device(s)        Comments: uses SPC for mobility, independent with ADLs, does not drive        OT Problem List: Decreased cognition       AM-PAC OT "6 Clicks" Daily Activity     Outcome Measure Help from another person eating meals?: None Help from  another person taking care of personal grooming?: A Little Help from another person toileting, which includes using toliet, bedpan, or urinal?: A Little Help from another person bathing (including washing, rinsing, drying)?: A Little Help from another person to put on and taking off regular upper body clothing?: A Little Help from another person to put on and taking off regular lower body clothing?: A Little 6 Click Score: 19   End of Session    Activity Tolerance: Patient tolerated treatment well Patient left: in bed;with call bell/phone within reach;with bed alarm set  OT Visit Diagnosis: Muscle weakness (generalized) (M62.81)                Time: 0600-4599 OT Time Calculation (min): 15 min Charges:  OT General Charges $OT Visit: 1 Visit OT Evaluation $OT Eval Low Complexity: 1 Low   Guadelupe Sabin, OTR/L  [phone] 08/28/2019, 8:37 AM

## 2019-08-29 ENCOUNTER — Telehealth: Payer: Self-pay

## 2019-08-29 DIAGNOSIS — I639 Cerebral infarction, unspecified: Secondary | ICD-10-CM

## 2019-08-29 DIAGNOSIS — I499 Cardiac arrhythmia, unspecified: Secondary | ICD-10-CM

## 2019-08-29 DIAGNOSIS — E038 Other specified hypothyroidism: Secondary | ICD-10-CM

## 2019-08-29 LAB — GLUCOSE, CAPILLARY
Glucose-Capillary: 106 mg/dL — ABNORMAL HIGH (ref 70–99)
Glucose-Capillary: 88 mg/dL (ref 70–99)
Glucose-Capillary: 262 mg/dL — ABNORMAL HIGH (ref 70–99)
Glucose-Capillary: 176 mg/dL — ABNORMAL HIGH (ref 70–99)
Glucose-Capillary: 112 mg/dL — ABNORMAL HIGH (ref 70–99)

## 2019-08-29 MED ORDER — INSULIN GLARGINE 100 UNIT/ML ~~LOC~~ SOLN
36.0000 [IU] | Freq: Every day | SUBCUTANEOUS | Status: DC
  Filled 2019-08-29 (×3): qty 0.36

## 2019-08-29 MED ORDER — INSULIN ASPART 100 UNIT/ML ~~LOC~~ SOLN: 12.0000 [IU] | Freq: Three times a day (TID) | SUBCUTANEOUS | Status: DC

## 2019-08-29 MED ORDER — FUROSEMIDE 20 MG PO TABS
20.0000 mg | ORAL_TABLET | Freq: Two times a day (BID) | ORAL | Status: DC
  Administered 2019-08-29 – 2019-09-03 (×9): 20 mg via ORAL
  Filled 2019-08-29 (×10): qty 1

## 2019-08-29 NOTE — Telephone Encounter (Signed)
Order placed. Will have monitor ,mailed to pt.

## 2019-08-29 NOTE — Progress Notes (Signed)
PASRR received: [id number].  Memory Argue, LCSW Transitions of Care Clinical Social Worker [facility] Ph: [phone]

## 2019-08-29 NOTE — Progress Notes (Signed)
PROGRESS NOTE [facility]   William Sharp  HXT:[id number]  DOB: 07-04-1940  DOA: 08/27/2019 PCP: Patient, No Pcp Per   Brief Admission Hx: 79 y.o. male with medical history significant of CAD, type 2 diabetes, diabetic peripheral neuropathy, hyperlipidemia, hypothyroidism, obesity, TIA who is brought to the emergency department via EMS after stopping to talk to his wife while he was in the parking lot ready to go to the pharmacy to pick up his insulin.  MDM/Assessment & Plan:   1. Acute CVA - full stroke work up completed.  PT recommending SNF.  Wife and patient seem agreeable to SNF.  Neurology consulted. Recommending DAPT x 30 days then aspirin daily afterwards.  Working to better control diabetes.  Added crestor 10 mg daily for uncontrolled LDL.    2. CAD - stable, will need follow up with Dr. Antoine Poche after discharge.  No chest pain symptoms. Crestor added for uncontrolled lipids.  3. HTN - permissive hypertension allowed.  4. Uncontrolled type 2 DM as evidenced by A1c of 11.2%.- titrating insulin doses for better glycemic control.    DVT prophylaxis: SCD Code Status: Full  Family Communication: unable to reach by telephone today, multiple calls unanswered Disposition Plan: He is stable to discharge to SNF when bed available.  Awaiting insurance authorization.    Consultants:  neurology  Procedures: 2D Echocardiogram IMPRESSIONS  1. Left ventricular ejection fraction, by estimation, is 40%. The left  ventricle has mild to moderately decreased function. The left ventricle  demonstrates regional wall motion abnormalities (see scoring  diagram/findings for description). The left  ventricular internal cavity size was mildly dilated. There is severe left  ventricular hypertrophy. Left ventricular diastolic parameters are  consistent with Grade I diastolic dysfunction (impaired relaxation).  2. Right ventricular systolic function is normal. The right ventricular    size is normal. Tricuspid regurgitation signal is inadequate for assessing  PA pressure.  3. Left atrial size was mildly dilated.  4. The mitral valve is grossly normal. Trivial mitral valve  regurgitation.  5. The aortic valve is tricuspid. Aortic valve regurgitation is not  visualized. Mild to moderate aortic valve sclerosis/calcification is  present, without any evidence of aortic stenosis.  6. Aortic dilatation noted. There is mild dilatation of the ascending  aorta.  7. The inferior vena cava is normal in size with greater than 50%  respiratory variability, suggesting right atrial pressure of 3 mmHg.   IMPRESSION:  CAROTID DOPPLERS Color duplex indicates moderate heterogeneous and calcified plaque, with no hemodynamically significant stenosis by duplex criteria in the extracranial cerebrovascular circulation.  Signed,  Yvone Neu. Reyne Dumas, RPVI  [facility]  [facility]   Electronically Signed   By: Gilmer Mor D.O.   On: 08/28/2019 13:43  Antimicrobials:     Subjective: Pt reports no specific complaints today.  He is a little confused at times.  He seems agreeable to SNF.      Objective: Vitals:   08/29/19 0100 08/29/19 0449 08/29/19 0848 08/29/19 1300  BP: (!) 141/70 (!) 145/68 (!) 141/72 137/76  Pulse: 72 71 70 69  Resp: 17 17 18 19   Temp: 98.6 F (37 C)  98.7 F (37.1 C) 98.5 F (36.9 C)  TempSrc: Oral  Oral Oral  SpO2: 100% 99% 96% 98%  Weight:      Height:        Intake/Output Summary (Last 24 hours) at 08/29/2019 1607 Last data filed at 08/29/2019 1357 Gross per 24  hour  Intake 720 ml  Output --  Net 720 ml   Filed Weights   08/27/19 1626  Weight: 108.9 kg     REVIEW OF SYSTEMS  As per history otherwise all reviewed and reported negative  Exam:  General exam: elderly weak male, sitting up in bed, no apparent distress.  Respiratory system: Clear. No increased work of  breathing. Cardiovascular system: S1 & S2 heard. No JVD, murmurs, gallops, clicks or pedal edema. Gastrointestinal system: Abdomen is nondistended, soft and nontender. Normal bowel sounds heard. Central nervous system: Alert and oriented to person only. No focal neurological deficits. Extremities: no CCE.  Data Reviewed: Basic Metabolic Panel: Recent Labs  Lab 08/27/19 1658  NA 136  K 4.5  CL 103  CO2 25  GLUCOSE 397*  BUN 25*  CREATININE 1.18  CALCIUM 9.0   Liver Function Tests: Recent Labs  Lab 08/27/19 1658  AST 12*  ALT 12  ALKPHOS 54  BILITOT 0.8  PROT 6.4*  ALBUMIN 3.6   No results for input(s): LIPASE, AMYLASE in the last 168 hours. No results for input(s): AMMONIA in the last 168 hours. CBC: Recent Labs  Lab 08/27/19 1658  WBC 5.7  NEUTROABS 4.3  HGB 13.4  HCT 41.1  MCV 92.2  PLT 254   Cardiac Enzymes: No results for input(s): CKTOTAL, CKMB, CKMBINDEX, TROPONINI in the last 168 hours. CBG (last 3)  Recent Labs    08/29/19 0337 08/29/19 0817 08/29/19 1241  GLUCAP 106* 88 262*   Recent Results (from the past 240 hour(s))  Respiratory Panel by RT PCR (Flu A&B, Covid) - Nasopharyngeal Swab     Status: None   Collection Time: 08/27/19  6:27 PM   Specimen: Nasopharyngeal Swab  Result Value Ref Range Status   SARS Coronavirus 2 by RT PCR NEGATIVE NEGATIVE Final    Comment: (NOTE) SARS-CoV-2 target nucleic acids are NOT DETECTED. The SARS-CoV-2 RNA is generally detectable in upper respiratoy specimens during the acute phase of infection. The lowest concentration of SARS-CoV-2 viral copies this assay can detect is 131 copies/mL. A negative result does not preclude SARS-Cov-2 infection and should not be used as the sole basis for treatment or other patient management decisions. A negative result may occur with  improper specimen collection/handling, submission of specimen other than nasopharyngeal swab, presence of viral mutation(s) within the areas  targeted by this assay, and inadequate number of viral copies (<131 copies/mL). A negative result must be combined with clinical observations, patient history, and epidemiological information. The expected result is Negative. Fact Sheet for Patients:  https://www.moore.com/ Fact Sheet for Healthcare Providers:  https://www.young.biz/ This test is not yet ap proved or cleared by the [place] FDA and  has been authorized for detection and/or diagnosis of SARS-CoV-2 by FDA under an Emergency Use Authorization (EUA). This EUA will remain  in effect (meaning this test can be used) for the duration of the COVID-19 declaration under Section 564(b)(1) of the Act, 21 U.S.C. section 360bbb-3(b)(1), unless the authorization is terminated or revoked sooner.    Influenza A by PCR NEGATIVE NEGATIVE Final   Influenza B by PCR NEGATIVE NEGATIVE Final    Comment: (NOTE) The Xpert Xpress SARS-CoV-2/FLU/RSV assay is intended as an aid in  the diagnosis of influenza from Nasopharyngeal swab specimens and  should not be used as a sole basis for treatment. Nasal washings and  aspirates are unacceptable for Xpert Xpress SARS-CoV-2/FLU/RSV  testing. Fact Sheet for Patients: https://www.moore.com/ Fact Sheet for Healthcare Providers:  https://www.young.biz/ This test is not yet approved or cleared by the [place] and  has been authorized for detection and/or diagnosis of SARS-CoV-2 by  FDA under an Emergency Use Authorization (EUA). This EUA will remain  in effect (meaning this test can be used) for the duration of the  Covid-19 declaration under Section 564(b)(1) of the Act, 21  U.S.C. section 360bbb-3(b)(1), unless the authorization is  terminated or revoked. Performed at [facility], [address] [place], [place] [address]      Studies: CT HEAD WO CONTRAST  Result Date: 08/27/2019 CLINICAL DATA:  TIA EXAM:  CT HEAD WITHOUT CONTRAST TECHNIQUE: Contiguous axial images were obtained from the base of the skull through the vertex without intravenous contrast. COMPARISON:  CT head January 03, 2018 FINDINGS: Brain: Remote right posterior temporal and left parietal infarcts are unchanged from comparison exam. Few remote appearing lacunar type infarcts are noted in the bilateral basal ganglia. No evidence of acute infarction, hemorrhage, hydrocephalus, extra-axial collection or mass lesion/mass effect. Symmetric prominence of the ventricles, cisterns and sulci compatible with parenchymal volume loss. Patchy areas of white matter hypoattenuation are most compatible with chronic microvascular angiopathy. Faint senescent mineralization of the basal ganglia is noted. Vascular: Atherosclerotic calcification of the carotid siphons and intradural vertebral arteries. No hyperdense vessel. Skull: Chronic soft tissue thickening of the posterior left high parietal scalp may reflect an area of scarring. No calvarial fracture or suspicious osseous lesion. No scalp swelling or hematoma. Sinuses/Orbits: Minimal mural thickening in the ethmoid sinuses. Remaining paranasal sinuses and mastoid air cells are predominantly clear. Small amount of debris in the right external auditory canal may reflect cerumen. Orbital structures are unremarkable aside from prior lens extractions. Other: None IMPRESSION: 1. No acute intracranial abnormality. 2. Remote right posterior temporal and left parietal infarcts. Chronic appearing a basal ganglia lacunar infarcts as well. 3. Chronic microvascular angiopathy and parenchymal volume loss. Electronically Signed   By: Kreg Shropshire M.D.   On: 08/27/2019 17:37   MR ANGIO HEAD WO CONTRAST  Result Date: 08/28/2019 CLINICAL DATA:  Abnormal speech EXAM: MRI HEAD WITHOUT CONTRAST MRA HEAD WITHOUT CONTRAST TECHNIQUE: Multiplanar, multiecho pulse sequences of the brain and surrounding structures were obtained without  intravenous contrast. Angiographic images of the head were obtained using MRA technique without contrast. COMPARISON:  None. FINDINGS: MRI HEAD Brain: There are small foci of restricted diffusion in the parasagittal left frontoparietal lobes. Chronic infarcts are present the right occipitotemporal lobes, right occipital lobe abutting the parieto-occipital sulcus, and left superior parietal lobule. Chronic small vessel infarcts the basal ganglia bilaterally and right thalamus. There are chronic blood products associated with the right occipitotemporal infarct. There is prominence of the ventricles and sulci reflecting generalized parenchymal volume loss with superimposed ex vacuo ventricular dilatation adjacent to infarcts. Additional patchy T2 hyperintensity in the supratentorial white matter is nonspecific but probably reflects mild chronic microvascular ischemic changes. There is no hydrocephalus or extra-axial fluid collection. Vascular: Major vessel flow voids at the skull base are preserved. Skull and upper cervical spine: Normal marrow signal is preserved. Sinuses/Orbits: Minor mucosal thickening. No significant orbital finding. Other: Sella is unremarkable.  Mastoid air cells are clear. MRA HEAD Intracranial internal carotid arteries are patent with atherosclerotic irregularity. Middle cerebral arteries are patent with mild distal stenosis. Proximal anterior cerebral arteries are patent. There is high-grade stenosis of the precallosal left ACA. Additional high-grade proximal supracallosal right ACA stenosis. Intracranial vertebral arteries, basilar artery, posterior cerebral arteries are patent. There is  no aneurysm. IMPRESSION: Few small acute infarcts in the left ACA territory. High-grade stenosis of the precallosal left ACA. Chronic infarcts and additional intracranial atherosclerosis detailed above. Electronically Signed   By: Macy Mis M.D.   On: 08/28/2019 12:00   MR BRAIN WO CONTRAST  Result  Date: 08/28/2019 CLINICAL DATA:  Abnormal speech EXAM: MRI HEAD WITHOUT CONTRAST MRA HEAD WITHOUT CONTRAST TECHNIQUE: Multiplanar, multiecho pulse sequences of the brain and surrounding structures were obtained without intravenous contrast. Angiographic images of the head were obtained using MRA technique without contrast. COMPARISON:  None. FINDINGS: MRI HEAD Brain: There are small foci of restricted diffusion in the parasagittal left frontoparietal lobes. Chronic infarcts are present the right occipitotemporal lobes, right occipital lobe abutting the parieto-occipital sulcus, and left superior parietal lobule. Chronic small vessel infarcts the basal ganglia bilaterally and right thalamus. There are chronic blood products associated with the right occipitotemporal infarct. There is prominence of the ventricles and sulci reflecting generalized parenchymal volume loss with superimposed ex vacuo ventricular dilatation adjacent to infarcts. Additional patchy T2 hyperintensity in the supratentorial white matter is nonspecific but probably reflects mild chronic microvascular ischemic changes. There is no hydrocephalus or extra-axial fluid collection. Vascular: Major vessel flow voids at the skull base are preserved. Skull and upper cervical spine: Normal marrow signal is preserved. Sinuses/Orbits: Minor mucosal thickening. No significant orbital finding. Other: Sella is unremarkable.  Mastoid air cells are clear. MRA HEAD Intracranial internal carotid arteries are patent with atherosclerotic irregularity. Middle cerebral arteries are patent with mild distal stenosis. Proximal anterior cerebral arteries are patent. There is high-grade stenosis of the precallosal left ACA. Additional high-grade proximal supracallosal right ACA stenosis. Intracranial vertebral arteries, basilar artery, posterior cerebral arteries are patent. There is no aneurysm. IMPRESSION: Few small acute infarcts in the left ACA territory. High-grade  stenosis of the precallosal left ACA. Chronic infarcts and additional intracranial atherosclerosis detailed above. Electronically Signed   By: Macy Mis M.D.   On: 08/28/2019 12:00   US Carotid Bilateral (at [facility] and [facility] only)  Result Date: 08/28/2019 CLINICAL DATA:  79 year old male with a history of TIA EXAM: BILATERAL CAROTID DUPLEX ULTRASOUND TECHNIQUE: Pearline Cables scale imaging, color Doppler and duplex ultrasound were performed of bilateral carotid and vertebral arteries in the neck. COMPARISON:  None. FINDINGS: Criteria: Quantification of carotid stenosis is based on velocity parameters that correlate the residual internal carotid diameter with NASCET-based stenosis levels, using the diameter of the distal internal carotid lumen as the denominator for stenosis measurement. The following velocity measurements were obtained: RIGHT ICA:  Systolic 77 cm/sec, Diastolic 17 cm/sec CCA:  80 cm/sec SYSTOLIC ICA/CCA RATIO:  5.80 ECA:  219 cm/sec LEFT ICA:  Systolic 75 cm/sec, Diastolic 19 cm/sec CCA:  998 cm/sec SYSTOLIC ICA/CCA RATIO:  0.7 ECA:  174 cm/sec Right Brachial SBP: Not acquired Left Brachial SBP: Not acquired RIGHT CAROTID ARTERY: No significant calcifications of the right common carotid artery. Intermediate waveform maintained. Moderate heterogeneous and partially calcified plaque at the right carotid bifurcation. No significant lumen shadowing. Low resistance waveform of the right ICA. No significant tortuosity. RIGHT VERTEBRAL ARTERY: Antegrade flow with low resistance waveform. LEFT CAROTID ARTERY: No significant calcifications of the left common carotid artery. Intermediate waveform maintained. Moderate heterogeneous and partially calcified plaque at the left carotid bifurcation. No significant lumen shadowing. Low resistance waveform of the left ICA. No significant tortuosity. LEFT VERTEBRAL ARTERY:  Antegrade flow with low resistance waveform. IMPRESSION: Color duplex indicates moderate  heterogeneous and calcified plaque, with no  hemodynamically significant stenosis by duplex criteria in the extracranial cerebrovascular circulation. Signed, Yvone NeuJaime S. Reyne DumasWagner, DO, RPVI [facility] Electronically Signed   By: Gilmer MorJaime  Wagner D.O.   On: 08/28/2019 13:43   DG CHEST PORT 1 VIEW  Result Date: 08/27/2019 CLINICAL DATA:  TIA EXAM: PORTABLE CHEST 1 VIEW COMPARISON:  01/03/2018 FINDINGS: Post sternotomy changes. Minimal scarring or atelectasis left base. No consolidation, pleural effusion, or pneumothorax. Mild cardiomegaly with aortic atherosclerosis. Retrocardiac opacity possibly due to hiatal hernia. IMPRESSION: 1. Cardiomegaly without edema or focal airspace disease 2. Possible hiatal hernia Electronically Signed   By: Jasmine PangKim  Fujinaga M.D.   On: 08/27/2019 19:45   ECHOCARDIOGRAM COMPLETE  Result Date: 08/28/2019    ECHOCARDIOGRAM REPORT   Patient Name:   William Sharp Date of Exam: 08/28/2019 Medical Rec #:  [id number]          Height:       72.0 in Accession #:    [id number]         Weight:       240.0 lb Date of Birth:  1940-06-21         BSA:          2.302 m Patient Age:    78 years           BP:           127/74 mmHg Patient Gender: M                  HR:           9 bpm. [facility] Procedure: 2D Echo Indications:    TIA 435.9 / G45.9  History:        Patient has no prior history of Echocardiogram examinations.                 CAD, TIA; Risk Factors:Hypertension, Dyslipidemia, Diabetes and                 Former Smoker.  Sonographer:    Jeryl ColumbiaJohanna Elliott RDCS (AE) Referring Phys: [id number] DAVID MANUEL ORTIZ IMPRESSIONS  1. Left ventricular ejection fraction, by estimation, is 40%. The left ventricle has mild to moderately decreased function. The left ventricle demonstrates regional wall motion abnormalities (see scoring diagram/findings for description). The left ventricular internal cavity size was mildly dilated. There  is severe left ventricular hypertrophy. Left ventricular diastolic parameters are consistent with Grade I diastolic dysfunction (impaired relaxation).  2. Right ventricular systolic function is normal. The right ventricular size is normal. Tricuspid regurgitation signal is inadequate for assessing PA pressure.  3. Left atrial size was mildly dilated.  4. The mitral valve is grossly normal. Trivial mitral valve regurgitation.  5. The aortic valve is tricuspid. Aortic valve regurgitation is not visualized. Mild to moderate aortic valve sclerosis/calcification is present, without any evidence of aortic stenosis.  6. Aortic dilatation noted. There is mild dilatation of the ascending aorta.  7. The inferior vena cava is normal in size with greater than 50% respiratory variability, suggesting right atrial pressure of 3 mmHg. FINDINGS  Left Ventricle: Left ventricular ejection fraction, by estimation, is 40%. The left ventricle has mild to moderately decreased function. The left ventricle demonstrates regional wall motion abnormalities. The left ventricular internal cavity size was mildly dilated. There is severe left ventricular hypertrophy. Left ventricular diastolic parameters are consistent with Grade I diastolic dysfunction (impaired relaxation).  LV Wall Scoring: The inferior wall is akinetic. Right  Ventricle: The right ventricular size is normal. No increase in right ventricular wall thickness. Right ventricular systolic function is normal. Tricuspid regurgitation signal is inadequate for assessing PA pressure. Left Atrium: Left atrial size was mildly dilated. Right Atrium: Right atrial size was normal in size. Pericardium: There is no evidence of pericardial effusion. Mitral Valve: The mitral valve is grossly normal. Mild mitral annular calcification. Trivial mitral valve regurgitation. Tricuspid Valve: The tricuspid valve is grossly normal. Tricuspid valve regurgitation is trivial. Aortic Valve: The aortic valve  is tricuspid. Aortic valve regurgitation is not visualized. Mild to moderate aortic valve sclerosis/calcification is present, without any evidence of aortic stenosis. Moderate aortic valve annular calcification. Pulmonic Valve: The pulmonic valve was grossly normal. Pulmonic valve regurgitation is not visualized. Aorta: Aortic dilatation noted. There is mild dilatation of the ascending aorta. Venous: The inferior vena cava is normal in size with greater than 50% respiratory variability, suggesting right atrial pressure of 3 mmHg. IAS/Shunts: No atrial level shunt detected by color flow Doppler.  LEFT VENTRICLE PLAX 2D LVIDd:         5.68 cm  Diastology LVIDs:         4.80 cm  LV e' lateral:   9.73 cm/s LV PW:         1.65 cm  LV E/e' lateral: 5.5 LV IVS:        1.74 cm  LV e' medial:    4.12 cm/s LVOT diam:     2.10 cm  LV E/e' medial:  12.9 LVOT Area:     3.46 cm  RIGHT VENTRICLE RV S prime:     8.59 cm/s TAPSE (M-mode): 1.4 cm LEFT ATRIUM           Index LA diam:      4.70 cm 2.04 cm/m LA Vol (A2C): 58.2 ml 25.28 ml/m LA Vol (A4C): 76.7 ml 33.32 ml/m   AORTA Ao Root diam: 3.60 cm MITRAL VALVE MV Area (PHT): 2.57 cm    SHUNTS MV Decel Time: 295 msec    Systemic Diam: 2.10 cm MV E velocity: 53.10 cm/s MV A velocity: 87.80 cm/s MV E/A ratio:  0.60 Nona Dell MD Electronically signed by Nona Dell MD Signature Date/Time: 08/28/2019/10:11:07 AM    Final      Scheduled Meds:  aspirin  300 mg Rectal Daily   Or   aspirin  325 mg Oral Daily   clopidogrel  75 mg Oral Q breakfast   furosemide  20 mg Oral BID   insulin aspart  0-15 Units Subcutaneous TID WC   insulin aspart  0-5 Units Subcutaneous QHS   insulin aspart  12 Units Subcutaneous TID WC   insulin glargine  40 Units Subcutaneous Daily   rosuvastatin  10 mg Oral QPM   Continuous Infusions:  Principal Problem:   Acute CVA (cerebrovascular accident) (HCC) Active Problems:   CAD (coronary artery disease)   HTN (hypertension),  benign   Diabetic neuropathy (HCC)   Hyperlipemia   Obesity   Other specified hypothyroidism   Type 2 diabetes mellitus (HCC)   Time spent:   Standley Dakins, MD [facility] 08/29/2019, 4:07 PM    LOS: 1 day  How to contact the [facility] Attending or Consulting provider 7A - 7P or covering provider during after hours 7P -7A, for this patient?  1. Check the care team in [facility] and look for a) attending/consulting [facility] provider listed and b) the [facility] team listed 2. Log into [email or web address] and use [facility]  universal password to access. If you do not have the password, please contact the hospital operator. 3. Locate the [facility] provider you are looking for under [facility] and page to a number that you can be directly reached. 4. If you still have difficulty reaching the provider, please page the [facility] (Director on Call) for the Hospitalists listed on amion for assistance.

## 2019-08-29 NOTE — Care Management Important Message (Signed)
Important Message  Patient Details  Name: William Sharp MRN: [id number] Date of Birth: 04/09/1941   Medicare Important Message Given:  Yes     Corey Harold 08/29/2019, 12:41 PM

## 2019-08-29 NOTE — [place] FL2 (Signed)
[place] MEDICAID FL2 LEVEL OF CARE SCREENING TOOL     IDENTIFICATION  Patient Name: William Sharp Birthdate: Oct 26, 1940 Sex: male Admission Date (Current Location): 08/27/2019  [facility] and [place] Number:  [employer] and Address:  [facility],  [address] [place] [address]      Provider Number: [id number]  Attending Physician Name and Address:  Cleora Fleet, MD  Relative Name and Phone Number:  Vicie Mutters (daughter) [facility]: [phone]    Current Level of Care: Hospital Recommended Level of Care: Skilled Nursing Facility Prior Approval Number:    Date Approved/Denied:   PASRR Number:    Discharge Plan: SNF    Current Diagnoses: Patient Active Problem List   Diagnosis Date Noted  . Acute CVA (cerebrovascular accident) (HCC) 08/28/2019  . Type 2 diabetes mellitus (HCC)   . Cellulitis and abscess of leg 05/11/2015  . Other specified hypothyroidism 01/21/2015  . Long-term insulin use (HCC) 10/19/2014  . CAD (coronary artery disease)   . HTN (hypertension), benign   . Diabetic neuropathy (HCC)   . Hyperlipemia   . Obesity     Orientation RESPIRATION BLADDER Height & Weight     Self, Place, Time  Normal Incontinent Weight: 240 lb (108.9 kg) Height:  6' (182.9 cm)  BEHAVIORAL SYMPTOMS/MOOD NEUROLOGICAL BOWEL NUTRITION STATUS      Continent Diet(Heart healthy)  AMBULATORY STATUS COMMUNICATION OF NEEDS Skin   Limited Assist Verbally Skin abrasions(Abrasion (elbow); Ecchymosis (right buttock, left arm); MASD (groin))                       Personal Care Assistance Level of Assistance  Bathing, Dressing Bathing Assistance: Limited assistance   Dressing Assistance: Limited assistance     Functional Limitations Info             SPECIAL CARE FACTORS FREQUENCY                       Contractures Contractures Info: Not present    Additional Factors Info  Code Status, Insulin Sliding Scale Code Status  Info: Full     Insulin Sliding Scale Info: CBG 70-120: 0 units; CBG 121-150: 0 units; CBG 151-200: 0 units; CBG 201-250: 2 units; CBG 251-300: 3 units; CBG 301-350: 4 units; CBG 351-400: 5 units; CBG>400 call MD and obtain STAT lab verification       Current Medications (08/29/2019):  This is the current hospital active medication list Current Facility-Administered Medications  Medication Dose Route Frequency Provider Last Rate Last Admin  . aspirin suppository 300 mg  300 mg Rectal Daily Bobette Mo, MD       Or  . aspirin tablet 325 mg  325 mg Oral Daily Bobette Mo, MD   325 mg at 08/29/19 0810  . clopidogrel (PLAVIX) tablet 75 mg  75 mg Oral Q breakfast Beryle Beams, MD   75 mg at 08/29/19 0811  . insulin aspart (novoLOG) injection 0-15 Units  0-15 Units Subcutaneous TID WC Johnson, Clanford L, MD      . insulin aspart (novoLOG) injection 0-5 Units  0-5 Units Subcutaneous QHS Johnson, Clanford L, MD      . insulin aspart (novoLOG) injection 10 Units  10 Units Subcutaneous TID WC Johnson, Clanford L, MD      . insulin glargine (LANTUS) injection 40 Units  40 Units Subcutaneous Daily Bobette Mo, MD   40 Units at 08/29/19 0908  .  nitroGLYCERIN (NITROSTAT) SL tablet 0.4 mg  0.4 mg Sublingual Q5 min PRN Reubin Milan, MD      . rosuvastatin (CRESTOR) tablet 10 mg  10 mg Oral QPM Murlean Iba, MD         Discharge Medications: Please see discharge summary for a list of discharge medications.  Relevant Imaging Results:  Relevant Lab Results:   Additional Information SSN#: [id number]  Sherie Don, LCSW

## 2019-08-29 NOTE — Telephone Encounter (Signed)
-----   Message from Ellsworth Lennox, [place] sent at 08/29/2019  9:31 AM EDT ----- Regarding: 30-day monitor Can you please arrange for this patient to have a 30-day monitor for CVA per Dr. Gerilyn Pilgrim.   Thanks,  [place]

## 2019-08-29 NOTE — Progress Notes (Signed)
    Consult placed by Neurology for 30-day event monitor for CVA. Will reach out to the office staff to assist with arranging this for the patient.    Signed, Ellsworth Lennox, PA-C 08/29/2019, 9:30 AM Pager: [phone]

## 2019-08-29 NOTE — NC FL2 (Signed)
[place] LEVEL OF CARE SCREENING TOOL     IDENTIFICATION  Patient Name: William Sharp Birthdate: [birth year] Sex: male Admission Date (Current Location): 08/27/2019  [facility] and [place] Number:  [employer] and Address:  [facility] [address], [place]      Provider Number: [id number]  Attending Physician Name and Address:  Murlean Iba, MD  Relative Name and Phone Number:  Terri Piedra (daughter) [facility]: [phone]    Current Level of Care: Hospital Recommended Level of Care: [place] Prior Approval Number:    Date Approved/Denied:   PASRR Number: [id number]  Discharge Plan: SNF    Current Diagnoses: Patient Active Problem List   Diagnosis Date Noted  . Acute CVA (cerebrovascular accident) ([place]) 08/28/2019  . Type 2 diabetes mellitus ([place])   . Cellulitis and abscess of leg 05/11/2015  . Other specified hypothyroidism 01/21/2015  . Long-term insulin use ([place]) 10/19/2014  . CAD (coronary artery disease)   . HTN (hypertension), benign   . Diabetic neuropathy ([place])   . Hyperlipemia   . Obesity     Orientation RESPIRATION BLADDER Height & Weight     Self, Place, Time  Normal Incontinent Weight: 240 lb (108.9 kg) Height:  6' (182.9 cm)  BEHAVIORAL SYMPTOMS/MOOD NEUROLOGICAL BOWEL NUTRITION STATUS      Continent Diet(Heart healthy)  AMBULATORY STATUS COMMUNICATION OF NEEDS Skin   Limited Assist Verbally Skin abrasions(Abrasion (elbow); Ecchymosis (right buttock, left arm); MASD (groin))                       Personal Care Assistance Level of Assistance  Bathing, Dressing Bathing Assistance: Limited assistance   Dressing Assistance: Limited assistance     Functional Limitations Info             SPECIAL CARE FACTORS FREQUENCY                       Contractures Contractures Info: Not present    Additional Factors Info  Code Status, Insulin Sliding Scale Code  Status Info: Full     Insulin Sliding Scale Info: CBG 70-120: 0 units; CBG 121-150: 0 units; CBG 151-200: 0 units; CBG 201-250: 2 units; CBG 251-300: 3 units; CBG 301-350: 4 units; CBG 351-400: 5 units; CBG>400 call MD and obtain STAT lab verification       Current Medications (08/29/2019):  This is the current hospital active medication list Current Facility-Administered Medications  Medication Dose Route Frequency Provider Last Rate Last Admin  . aspirin suppository 300 mg  300 mg Rectal Daily Reubin Milan, MD       Or  . aspirin tablet 325 mg  325 mg Oral Daily Reubin Milan, MD   325 mg at 08/29/19 0810  . clopidogrel (PLAVIX) tablet 75 mg  75 mg Oral Q breakfast Merlene Laughter, Kofi, MD   75 mg at 08/29/19 0811  . furosemide (LASIX) tablet 20 mg  20 mg Oral BID Johnson, Clanford L, MD      . insulin aspart (novoLOG) injection 0-15 Units  0-15 Units Subcutaneous TID WC Johnson, Clanford L, MD   8 Units at 08/29/19 1252  . insulin aspart (novoLOG) injection 0-5 Units  0-5 Units Subcutaneous QHS Johnson, Clanford L, MD      . insulin aspart (novoLOG) injection 12 Units  12 Units Subcutaneous TID WC Johnson, Clanford L, MD      . Derrill Memo  ON 08/30/2019] insulin glargine (LANTUS) injection 36 Units  36 Units Subcutaneous Daily Johnson, Clanford L, MD      . nitroGLYCERIN (NITROSTAT) SL tablet 0.4 mg  0.4 mg Sublingual Q5 min PRN Bobette Mo, MD      . rosuvastatin (CRESTOR) tablet 10 mg  10 mg Oral QPM Johnson, Clanford L, MD         Discharge Medications: Please see discharge summary for a list of discharge medications.  Relevant Imaging Results:  Relevant Lab Results:   Additional Information SSN#: [id number]  Ewing Schlein, LCSW

## 2019-08-29 NOTE — TOC Progression Note (Signed)
Transition of Care [facility]) - Progression Note    Patient Details  Name: William Sharp MRN: [id number] Date of Birth: 1941-03-25  Transition of Care [facility]) CM/SW Contact  Ewing Schlein, LCSW Phone Number: 08/29/2019, 12:13 PM  Clinical Narrative: Per progression with physician and PT recommendations, SNF is recommended unless the patient's family would prefer to have the patient discharge with [facility]. CSW spoke with patient's wife, William Sharp, to discuss SNF. Wife wants to discuss options with patient. CSW completed FL2. Referral for SNF sent. PASRR pending.  Expected Discharge Plan: Home w Home Health Services Barriers to Discharge: Continued Medical Work up  Expected Discharge Plan and Services Expected Discharge Plan: Home w Home Health Services   Living arrangements for the past 2 months: Single Family Home   HH Arranged: PT HH Agency: [facility] Date HH Agency Contacted: 08/28/19 Time HH Agency Contacted: 1607 Representative spoke with at [facility] Agency: Tim Justice  Readmission Risk Interventions No flowsheet data found.

## 2019-08-30 LAB — GLUCOSE, CAPILLARY
Glucose-Capillary: 83 mg/dL (ref 70–99)
Glucose-Capillary: 79 mg/dL (ref 70–99)
Glucose-Capillary: 90 mg/dL (ref 70–99)
Glucose-Capillary: 71 mg/dL (ref 70–99)
Glucose-Capillary: 150 mg/dL — ABNORMAL HIGH (ref 70–99)

## 2019-08-30 LAB — BASIC METABOLIC PANEL
Anion gap: 10 (ref 5–15)
BUN: 19 mg/dL (ref 8–23)
CO2: 28 mmol/L (ref 22–32)
Calcium: 9.1 mg/dL (ref 8.9–10.3)
Chloride: 101 mmol/L (ref 98–111)
Creatinine, Ser: 1.02 mg/dL (ref 0.61–1.24)
GFR calc Af Amer: >60 mL/min (ref >60)
GFR calc non Af Amer: >60 mL/min (ref >60)
Glucose, Bld: 119 mg/dL — ABNORMAL HIGH (ref 70–99)
Potassium: 3.3 mmol/L — ABNORMAL LOW (ref 3.5–5.1)
Sodium: 139 mmol/L (ref 135–145)

## 2019-08-30 LAB — MAGNESIUM: Magnesium: 2 mg/dL (ref 1.7–2.4)

## 2019-08-30 MED ORDER — POTASSIUM CHLORIDE CRYS ER 20 MEQ PO TBCR
40.0000 meq | EXTENDED_RELEASE_TABLET | Freq: Once | ORAL | Status: AC
  Administered 2019-08-30: 40 meq via ORAL
  Filled 2019-08-30: qty 2

## 2019-08-30 MED ORDER — HALOPERIDOL LACTATE 5 MG/ML IJ SOLN
2.0000 mg | Freq: Four times a day (QID) | INTRAMUSCULAR | Status: DC | PRN
  Administered 2019-08-30: 2 mg via INTRAMUSCULAR
  Filled 2019-08-30: qty 1

## 2019-08-30 MED ORDER — INSULIN GLARGINE 100 UNIT/ML ~~LOC~~ SOLN
30.0000 [IU] | Freq: Every day | SUBCUTANEOUS | Status: DC
  Filled 2019-08-30 (×3): qty 0.3

## 2019-08-30 MED ORDER — INSULIN ASPART 100 UNIT/ML ~~LOC~~ SOLN
5.0000 [IU] | Freq: Three times a day (TID) | SUBCUTANEOUS | Status: DC
  Administered 2019-08-31 – 2019-09-01 (×4): 5 [IU] via SUBCUTANEOUS

## 2019-08-30 MED ORDER — INSULIN ASPART 100 UNIT/ML ~~LOC~~ SOLN: 10.0000 [IU] | Freq: Three times a day (TID) | SUBCUTANEOUS | Status: DC

## 2019-08-30 MED ORDER — QUETIAPINE FUMARATE 25 MG PO TABS
25.0000 mg | ORAL_TABLET | Freq: Two times a day (BID) | ORAL | Status: DC
  Administered 2019-08-30: 25 mg via ORAL
  Filled 2019-08-30 (×3): qty 1

## 2019-08-30 NOTE — Progress Notes (Signed)
Late Entry: patient was complaint when writer gave medications patient knew were he was, the year,who the president was, his name and DOB. Lab came to obtain blood work this morning. Patient then became agitated and attempting to  pulling off telemetry heart monitor and gown. Refusing to let staff place gown and telemetry back on. Patient attempted to hit staff. Placed hand mitts on patient patient then pulled hand mitts off. MD was in the hall came to patients room. MD gave verbal to leave telemetry monitor off and ok to leave IV out. Writer and charge nurse Johnsie Cancel RN moved patient to room 314 to be closer to nurses station. Patients wife was called and updated. Patients wife came to room. After patients wife came to room patient still was attempting to get out of bed. MD aware. MD placed order for prn haldol and scheduled Seroquel refer to mar for correct dosage. Writer gave patient Seroquel at 1115. Patient rested some after Seroquel given. Patients wife left the patients room around 1600. Patient then became agitated attempting to get out of bed and hit staff. Charge nurse Johnsie Cancel RN gave prn haldol refer to mar for correct dosage at 1740.  1809 Patient is now resting in bed with bed rails up call bell in reach. Fall mat in place. Will continue to assess throughout shift.

## 2019-08-30 NOTE — TOC Progression Note (Signed)
Transition of Care [facility]) - Progression Note    Patient Details  Name: William Sharp MRN: [id number] Date of Birth: [birth year]  Transition of Care [facility]) CM/SW Contact  Elliot Gault, LCSW Phone Number: 08/30/2019, 12:03 PM  Clinical Narrative:     TOC following. Notified by MD and RN that pt medically stable for dc. Pt has some bed offers with some still pending. Initiated insurance auth this AM. MD starting pt on Seroquel for agitation.   TOC will follow up with pt's wife for SNF choice once agitation is managed and insurance auth obtained.  Expected Discharge Plan: Home w Home Health Services Barriers to Discharge: Continued Medical Work up  Expected Discharge Plan and Services Expected Discharge Plan: Home w Home Health Services       Living arrangements for the past 2 months: Single Family Home                           HH Arranged: PT HH Agency: [facility] Date HH Agency Contacted: 08/28/19 Time HH Agency Contacted: 1607 Representative spoke with at [facility] Agency: Tim Justice   Social Determinants of Health (SDOH) Interventions    Readmission Risk Interventions No flowsheet data found.

## 2019-08-30 NOTE — Progress Notes (Signed)
PROGRESS NOTE [facility]   William Sharp  JOA:[id number]  DOB: 1941-02-08  DOA: 08/27/2019 PCP: Patient, No Pcp Per   Brief Admission Hx: 79 y.o. male with medical history significant of CAD, type 2 diabetes, diabetic peripheral neuropathy, hyperlipidemia, hypothyroidism, obesity, TIA who is brought to the emergency department via EMS after stopping to talk to his wife while he was in the parking lot ready to go to the pharmacy to pick up his insulin.  MDM/Assessment & Plan:   1. Acute CVA - full stroke work up completed.  PT recommending SNF.  Wife and patient seem agreeable to SNF.  Neurology consulted. Recommending DAPT x 30 days then aspirin daily afterwards.  Working to better control diabetes.  Added crestor 10 mg daily for uncontrolled LDL. 2. Acute delirium / sundowning - haldol IM ordered for severe agitation, seroquel 25 mg BID.      3. CAD - stable, will need follow up with Dr. Antoine Poche after discharge.  No chest pain symptoms. Crestor added for uncontrolled lipids.  4. HTN - permissive hypertension allowed.  5. Uncontrolled type 2 DM as evidenced by A1c of 11.2%.- titrating insulin doses for better glycemic control.   DVT prophylaxis: SCD Code Status: Full  Family Communication: unable to reach by telephone today, multiple calls unanswered Disposition Plan: He is stable to discharge to SNF when bed available.  Awaiting insurance authorization.  TOC working on placement.    Consultants:  neurology  Procedures: 2D Echocardiogram IMPRESSIONS  1. Left ventricular ejection fraction, by estimation, is 40%. The left  ventricle has mild to moderately decreased function. The left ventricle  demonstrates regional wall motion abnormalities (see scoring  diagram/findings for description). The left  ventricular internal cavity size was mildly dilated. There is severe left  ventricular hypertrophy. Left ventricular diastolic parameters are  consistent with Grade I  diastolic dysfunction (impaired relaxation).  2. Right ventricular systolic function is normal. The right ventricular  size is normal. Tricuspid regurgitation signal is inadequate for assessing  PA pressure.  3. Left atrial size was mildly dilated.  4. The mitral valve is grossly normal. Trivial mitral valve  regurgitation.  5. The aortic valve is tricuspid. Aortic valve regurgitation is not  visualized. Mild to moderate aortic valve sclerosis/calcification is  present, without any evidence of aortic stenosis.  6. Aortic dilatation noted. There is mild dilatation of the ascending  aorta.  7. The inferior vena cava is normal in size with greater than 50%  respiratory variability, suggesting right atrial pressure of 3 mmHg.   IMPRESSION:  CAROTID DOPPLERS Color duplex indicates moderate heterogeneous and calcified plaque, with no hemodynamically significant stenosis by duplex criteria in the extracranial cerebrovascular circulation.  Signed,  Yvone Neu. Reyne Dumas, RPVI  [facility]  [facility]   Electronically Signed   By: Gilmer Mor D.O.   On: 08/28/2019 13:43  Antimicrobials:     Subjective: Pt agitated and confused this morning.       Objective: Vitals:   08/29/19 2040 08/30/19 0005 08/30/19 0358 08/30/19 1347  BP: 103/62 134/83 135/76 (!) 142/72  Pulse: 72 71 69 85  Resp: 16 16 16 16   Temp: 97.8 F (36.6 C) 98.2 F (36.8 C) 98 F (36.7 C)   TempSrc: Oral Oral Oral   SpO2: 100% 97% 98% 98%  Weight:      Height:        Intake/Output Summary (Last 24 hours) at 08/30/2019 1549 Last data filed  at 08/30/2019 0537 Gross per 24 hour  Intake 240 ml  Output 1925 ml  Net -1685 ml   Filed Weights   08/27/19 1626  Weight: 108.9 kg   REVIEW OF SYSTEMS  Actively confused  Exam:  General exam: elderly weak male, sitting up in bed, no apparent distress.  Respiratory system: Clear. No increased work  of breathing. Cardiovascular system: normal S1 & S2 heard. No JVD, murmurs, gallops, clicks or pedal edema. Gastrointestinal system: Abdomen is nondistended, soft and nontender. Normal bowel sounds heard. Central nervous system: Alert and oriented to person only. No focal neurological deficits. Extremities: no CCE.  Data Reviewed: Basic Metabolic Panel: Recent Labs  Lab 08/27/19 1658 08/30/19 1258  NA 136 139  K 4.5 3.3*  CL 103 101  CO2 25 28  GLUCOSE 397* 119*  BUN 25* 19  CREATININE 1.18 1.02  CALCIUM 9.0 9.1  MG  --  2.0   Liver Function Tests: Recent Labs  Lab 08/27/19 1658  AST 12*  ALT 12  ALKPHOS 54  BILITOT 0.8  PROT 6.4*  ALBUMIN 3.6   No results for input(s): LIPASE, AMYLASE in the last 168 hours. No results for input(s): AMMONIA in the last 168 hours. CBC: Recent Labs  Lab 08/27/19 1658  WBC 5.7  NEUTROABS 4.3  HGB 13.4  HCT 41.1  MCV 92.2  PLT 254   Cardiac Enzymes: No results for input(s): CKTOTAL, CKMB, CKMBINDEX, TROPONINI in the last 168 hours. CBG (last 3)  Recent Labs    08/30/19 0340 08/30/19 0725 08/30/19 1132  GLUCAP 83 79 90   Recent Results (from the past 240 hour(s))  Respiratory Panel by RT PCR (Flu A&B, Covid) - Nasopharyngeal Swab     Status: None   Collection Time: 08/27/19  6:27 PM   Specimen: Nasopharyngeal Swab  Result Value Ref Range Status   SARS Coronavirus 2 by RT PCR NEGATIVE NEGATIVE Final    Comment: (NOTE) SARS-CoV-2 target nucleic acids are NOT DETECTED. The SARS-CoV-2 RNA is generally detectable in upper respiratoy specimens during the acute phase of infection. The lowest concentration of SARS-CoV-2 viral copies this assay can detect is 131 copies/mL. A negative result does not preclude SARS-Cov-2 infection and should not be used as the sole basis for treatment or other patient management decisions. A negative result may occur with  improper specimen collection/handling, submission of specimen other than  nasopharyngeal swab, presence of viral mutation(s) within the areas targeted by this assay, and inadequate number of viral copies (<131 copies/mL). A negative result must be combined with clinical observations, patient history, and epidemiological information. The expected result is Negative. Fact Sheet for Patients:  PinkCheek.be Fact Sheet for Healthcare Providers:  GravelBags.it This test is not yet ap proved or cleared by the [place] FDA and  has been authorized for detection and/or diagnosis of SARS-CoV-2 by FDA under an Emergency Use Authorization (EUA). This EUA will remain  in effect (meaning this test can be used) for the duration of the COVID-19 declaration under Section 564(b)(1) of the Act, 21 U.S.C. section 360bbb-3(b)(1), unless the authorization is terminated or revoked sooner.    Influenza A by PCR NEGATIVE NEGATIVE Final   Influenza B by PCR NEGATIVE NEGATIVE Final    Comment: (NOTE) The Xpert Xpress SARS-CoV-2/FLU/RSV assay is intended as an aid in  the diagnosis of influenza from Nasopharyngeal swab specimens and  should not be used as a sole basis for treatment. Nasal washings and  aspirates are unacceptable for  Xpert Xpress SARS-CoV-2/FLU/RSV  testing. Fact Sheet for Patients: https://www.moore.com/ Fact Sheet for Healthcare Providers: https://www.young.biz/ This test is not yet approved or cleared by the [place] FDA and  has been authorized for detection and/or diagnosis of SARS-CoV-2 by  FDA under an Emergency Use Authorization (EUA). This EUA will remain  in effect (meaning this test can be used) for the duration of the  Covid-19 declaration under Section 564(b)(1) of the Act, 21  U.S.C. section 360bbb-3(b)(1), unless the authorization is  terminated or revoked. Performed at [facility], [address], [place], [place] [address]      Studies: No  results found.   Scheduled Meds: . aspirin  300 mg Rectal Daily   Or  . aspirin  325 mg Oral Daily  . clopidogrel  75 mg Oral Q breakfast  . furosemide  20 mg Oral BID  . insulin aspart  0-15 Units Subcutaneous TID WC  . insulin aspart  0-5 Units Subcutaneous QHS  . insulin aspart  10 Units Subcutaneous TID WC  . insulin glargine  30 Units Subcutaneous Daily  . potassium chloride  40 mEq Oral Once  . QUEtiapine  25 mg Oral BID  . rosuvastatin  10 mg Oral QPM   Continuous Infusions:  Principal Problem:   Acute CVA (cerebrovascular accident) (HCC) Active Problems:   CAD (coronary artery disease)   HTN (hypertension), benign   Diabetic neuropathy (HCC)   Hyperlipemia   Obesity   Other specified hypothyroidism   Type 2 diabetes mellitus (HCC)   Time spent:   Standley Dakins, MD [facility] 08/30/2019, 3:49 PM    LOS: 2 days  How to contact the [facility] Attending or Consulting provider 7A - 7P or covering provider during after hours 7P -7A, for this patient?  1. Check the care team in [facility] and look for a) attending/consulting TRH provider listed and b) the [facility] team listed 2. Log into [email or web address] and use [place]'s universal password to access. If you do not have the password, please contact the hospital operator. 3. Locate the [facility] provider you are looking for under [facility] and page to a number that you can be directly reached. 4. If you still have difficulty reaching the provider, please page the [facility] (Director on Call) for the Hospitalists listed on amion for assistance.

## 2019-08-31 LAB — BASIC METABOLIC PANEL
Anion gap: 12 (ref 5–15)
BUN: 22 mg/dL (ref 8–23)
CO2: 28 mmol/L (ref 22–32)
Calcium: 9 mg/dL (ref 8.9–10.3)
Chloride: 98 mmol/L (ref 98–111)
Creatinine, Ser: 1.12 mg/dL (ref 0.61–1.24)
GFR calc Af Amer: >60 mL/min (ref >60)
GFR calc non Af Amer: >60 mL/min (ref >60)
Glucose, Bld: 208 mg/dL — ABNORMAL HIGH (ref 70–99)
Potassium: 3.7 mmol/L (ref 3.5–5.1)
Sodium: 138 mmol/L (ref 135–145)

## 2019-08-31 LAB — GLUCOSE, CAPILLARY
Glucose-Capillary: 90 mg/dL (ref 70–99)
Glucose-Capillary: 170 mg/dL — ABNORMAL HIGH (ref 70–99)
Glucose-Capillary: 135 mg/dL — ABNORMAL HIGH (ref 70–99)
Glucose-Capillary: 54 mg/dL — ABNORMAL LOW (ref 70–99)
Glucose-Capillary: 119 mg/dL — ABNORMAL HIGH (ref 70–99)

## 2019-08-31 MED ORDER — INSULIN GLARGINE 100 UNIT/ML ~~LOC~~ SOLN
20.0000 [IU] | Freq: Every day | SUBCUTANEOUS | Status: DC
  Administered 2019-08-31: 20 [IU] via SUBCUTANEOUS
  Filled 2019-08-31 (×3): qty 0.2

## 2019-08-31 MED ORDER — QUETIAPINE FUMARATE 25 MG PO TABS
25.0000 mg | ORAL_TABLET | Freq: Three times a day (TID) | ORAL | Status: DC
  Administered 2019-08-31 – 2019-09-02 (×7): 25 mg via ORAL
  Filled 2019-08-31 (×7): qty 1

## 2019-08-31 NOTE — TOC Progression Note (Signed)
Transition of Care [facility]) - Progression Note    Patient Details  Name: William Sharp MRN: [id number] Date of Birth: 1940/12/30  Transition of Care [facility]) CM/SW Contact  Elliot Gault, LCSW Phone Number: 08/31/2019, 10:07 AM  Clinical Narrative:     TOC following. Received SNF auth from pt's insurance effective 09/01/19 with update due 09/04/19. Ref number [id number]. Faxed additional requested clinical to insurance this AM.   Pt continues with agitation. SNF, [facility], can accept pt when agitation resolves. [employer] (insurance CM Marylu Lund C) states they will need to be updated if agitation continues and pt cannot dc. Assigned TOC will follow.  Expected Discharge Plan: Home w Home Health Services Barriers to Discharge: Continued Medical Work up  Expected Discharge Plan and Services Expected Discharge Plan: Home w Home Health Services       Living arrangements for the past 2 months: Single Family Home                           HH Arranged: PT HH [facility]: [facility] Date HH Agency Contacted: 08/28/19 Time HH Agency Contacted: 1607 Representative spoke with at [facility] Agency: Tim Justice   Social Determinants of Health (SDOH) Interventions    Readmission Risk Interventions No flowsheet data found.

## 2019-08-31 NOTE — Progress Notes (Signed)
PROGRESS NOTE [facility]   William Sharp  RDE:[id number]  DOB: Dec 19, 1940  DOA: 08/27/2019 PCP: Patient, No Pcp Per   Brief Admission Hx: 79 y.o. male with medical history significant of CAD, type 2 diabetes, diabetic peripheral neuropathy, hyperlipidemia, hypothyroidism, obesity, TIA who is brought to the emergency department via EMS after stopping to talk to his wife while he was in the parking lot ready to go to the pharmacy to pick up his insulin.  MDM/Assessment & Plan:   1. Acute CVA - full stroke work up completed.  PT recommending SNF.  Wife and patient agreeable to SNF.  Neurology consulted. Recommending DAPT x 30 days then aspirin daily afterwards.  Working to better control diabetes.  Added crestor 10 mg daily for uncontrolled LDL. 2. Acute delirium / sundowning - haldol IM ordered for severe agitation, increased seroquel 25 mg TID.      3. CAD - stable, will need follow up with Dr. Percival Spanish after discharge.  No chest pain symptoms. Crestor added for uncontrolled lipids.  4. HTN - permissive hypertension allowed. BPs stable and we follow closely.  5. Uncontrolled type 2 DM as evidenced by A1c of 11.2%.- titrating insulin doses for better glycemic control.  6. Heart failure reduced EF - compensated, remains on lasix 20 mg BID.   DVT prophylaxis: SCD Code Status: Full  Family Communication: unable to reach by telephone today, multiple calls unanswered Disposition Plan: He is stable to discharge to SNF when bed available.  As confusion clears up he should be able to DC to SNF in next 24-48 hours pending insurance authorization.  Consultants:  neurology  Procedures: 2D Echocardiogram IMPRESSIONS  1. Left ventricular ejection fraction, by estimation, is 40%. The left  ventricle has mild to moderately decreased function. The left ventricle  demonstrates regional wall motion abnormalities (see scoring  diagram/findings for description). The left  ventricular  internal cavity size was mildly dilated. There is severe left  ventricular hypertrophy. Left ventricular diastolic parameters are  consistent with Grade I diastolic dysfunction (impaired relaxation).  2. Right ventricular systolic function is normal. The right ventricular  size is normal. Tricuspid regurgitation signal is inadequate for assessing  PA pressure.  3. Left atrial size was mildly dilated.  4. The mitral valve is grossly normal. Trivial mitral valve  regurgitation.  5. The aortic valve is tricuspid. Aortic valve regurgitation is not  visualized. Mild to moderate aortic valve sclerosis/calcification is  present, without any evidence of aortic stenosis.  6. Aortic dilatation noted. There is mild dilatation of the ascending  aorta.  7. The inferior vena cava is normal in size with greater than 50%  respiratory variability, suggesting right atrial pressure of 3 mmHg.   IMPRESSION:  CAROTID DOPPLERS Color duplex indicates moderate heterogeneous and calcified plaque, with no hemodynamically significant stenosis by duplex criteria in the extracranial cerebrovascular circulation.  Signed,  Dulcy Fanny. Dellia Nims, RPVI  [facility]  [facility]   Electronically Signed   By: Corrie Mckusick D.O.   On: 08/28/2019 13:43  Antimicrobials:     Subjective: Pt remains confused but much better today, eating breakfast and working with caretakers        Objective: Vitals:   08/30/19 0005 08/30/19 0358 08/30/19 1347 08/31/19 1320  BP: 134/83 135/76 (!) 142/72 120/68  Pulse: 71 69 85 75  Resp: 16 16 16 18   Temp: 98.2 F (36.8 C) 98 F (36.7 C)  98.1 F (36.7 C)  TempSrc: Oral Oral  Oral  SpO2: 97% 98% 98% 98%  Weight:      Height:        Intake/Output Summary (Last 24 hours) at 08/31/2019 1426 Last data filed at 08/31/2019 1331 Gross per 24 hour  Intake 480 ml  Output 1500 ml  Net -1020 ml   Filed Weights    08/27/19 1626  Weight: 108.9 kg   REVIEW OF SYSTEMS  Actively confused  Exam:  General exam: elderly weak male, sitting up in bed, no apparent distress.  Respiratory system: Clear. No increased work of breathing. Cardiovascular system: normal S1 & S2 heard. No JVD, murmurs, gallops, clicks or pedal edema. Gastrointestinal system: Abdomen is nondistended, soft and nontender. Normal bowel sounds heard. Central nervous system: Alert and oriented to person only. No focal neurological deficits. Extremities: no CCE.  Data Reviewed: Basic Metabolic Panel: Recent Labs  Lab 08/27/19 1658 08/30/19 1258 08/31/19 1027  NA 136 139 138  K 4.5 3.3* 3.7  CL 103 101 98  CO2 25 28 28   GLUCOSE 397* 119* 208*  BUN 25* 19 22  CREATININE 1.18 1.02 1.12  CALCIUM 9.0 9.1 9.0  MG  --  2.0  --    Liver Function Tests: Recent Labs  Lab 08/27/19 1658  AST 12*  ALT 12  ALKPHOS 54  BILITOT 0.8  PROT 6.4*  ALBUMIN 3.6   No results for input(s): LIPASE, AMYLASE in the last 168 hours. No results for input(s): AMMONIA in the last 168 hours. CBC: Recent Labs  Lab 08/27/19 1658  WBC 5.7  NEUTROABS 4.3  HGB 13.4  HCT 41.1  MCV 92.2  PLT 254   Cardiac Enzymes: No results for input(s): CKTOTAL, CKMB, CKMBINDEX, TROPONINI in the last 168 hours. CBG (last 3)  Recent Labs    08/30/19 2134 08/31/19 0743 08/31/19 1053  GLUCAP 150* 90 170*   Recent Results (from the past 240 hour(s))  Respiratory Panel by RT PCR (Flu A&B, Covid) - Nasopharyngeal Swab     Status: None   Collection Time: 08/27/19  6:27 PM   Specimen: Nasopharyngeal Swab  Result Value Ref Range Status   SARS Coronavirus 2 by RT PCR NEGATIVE NEGATIVE Final    Comment: (NOTE) SARS-CoV-2 target nucleic acids are NOT DETECTED. The SARS-CoV-2 RNA is generally detectable in upper respiratoy specimens during the acute phase of infection. The lowest concentration of SARS-CoV-2 viral copies this assay can detect is 131  copies/mL. A negative result does not preclude SARS-Cov-2 infection and should not be used as the sole basis for treatment or other patient management decisions. A negative result may occur with  improper specimen collection/handling, submission of specimen other than nasopharyngeal swab, presence of viral mutation(s) within the areas targeted by this assay, and inadequate number of viral copies (<131 copies/mL). A negative result must be combined with clinical observations, patient history, and epidemiological information. The expected result is Negative. Fact Sheet for Patients:  08/29/19 Fact Sheet for Healthcare Providers:  https://www.moore.com/ This test is not yet ap proved or cleared by the https://www.young.biz/ FDA and  has been authorized for detection and/or diagnosis of SARS-CoV-2 by FDA under an Emergency Use Authorization (EUA). This EUA will remain  in effect (meaning this test can be used) for the duration of the COVID-19 declaration under Section 564(b)(1) of the Act, 21 U.S.C. section 360bbb-3(b)(1), unless the authorization is terminated or revoked sooner.    Influenza A by PCR NEGATIVE NEGATIVE Final   Influenza B  by PCR NEGATIVE NEGATIVE Final    Comment: (NOTE) The Xpert Xpress SARS-CoV-2/FLU/RSV assay is intended as an aid in  the diagnosis of influenza from Nasopharyngeal swab specimens and  should not be used as a sole basis for treatment. Nasal washings and  aspirates are unacceptable for Xpert Xpress SARS-CoV-2/FLU/RSV  testing. Fact Sheet for Patients: https://www.moore.com/ Fact Sheet for Healthcare Providers: https://www.young.biz/ This test is not yet approved or cleared by the [place] FDA and  has been authorized for detection and/or diagnosis of SARS-CoV-2 by  FDA under an Emergency Use Authorization (EUA). This EUA will remain  in effect (meaning this test can  be used) for the duration of the  Covid-19 declaration under Section 564(b)(1) of the Act, 21  U.S.C. section 360bbb-3(b)(1), unless the authorization is  terminated or revoked. Performed at [facility], [address] [place], [place] [address]      Studies: No results found.   Scheduled Meds: . aspirin  300 mg Rectal Daily   Or  . aspirin  325 mg Oral Daily  . clopidogrel  75 mg Oral Q breakfast  . furosemide  20 mg Oral BID  . insulin aspart  0-15 Units Subcutaneous TID WC  . insulin aspart  0-5 Units Subcutaneous QHS  . insulin aspart  5 Units Subcutaneous TID WC  . insulin glargine  20 Units Subcutaneous Daily  . QUEtiapine  25 mg Oral TID  . rosuvastatin  10 mg Oral QPM   Continuous Infusions:  Principal Problem:   Acute CVA (cerebrovascular accident) (HCC) Active Problems:   CAD (coronary artery disease)   HTN (hypertension), benign   Diabetic neuropathy (HCC)   Hyperlipemia   Obesity   Other specified hypothyroidism   Type 2 diabetes mellitus (HCC)   Time spent:   Standley Dakins, MD [facility] 08/31/2019, 2:26 PM    LOS: 3 days  How to contact the [facility] Attending or Consulting provider 7A - 7P or covering provider during after hours 7P -7A, for this patient?  1. Check the care team in [facility] and look for a) attending/consulting TRH provider listed and b) the [facility] team listed 2. Log into [email or web address] and use [facility]'s universal password to access. If you do not have the password, please contact the hospital operator. 3. Locate the [facility] provider you are looking for under [facility] and page to a number that you can be directly reached. 4. If you still have difficulty reaching the provider, please page the [facility] (Director on Call) for the Hospitalists listed on amion for assistance.

## 2019-08-31 NOTE — Progress Notes (Signed)
Patient is combative, agitated and refusing to allow vital signs to be taken or midnight CBG to be taken.  MD is aware of issues with patient throughout the day on 08/30/19.  Patient sleeping, each time you try to take vitals or CBG patient wakes and starts swinging and curding.

## 2019-08-31 NOTE — Progress Notes (Signed)
Patient continues to refuse to have vital signs taken and refuses to cooperate with neuro checks.

## 2019-09-01 LAB — GLUCOSE, CAPILLARY
Glucose-Capillary: 230 mg/dL — ABNORMAL HIGH (ref 70–99)
Glucose-Capillary: 181 mg/dL — ABNORMAL HIGH (ref 70–99)
Glucose-Capillary: 163 mg/dL — ABNORMAL HIGH (ref 70–99)
Glucose-Capillary: 65 mg/dL — ABNORMAL LOW (ref 70–99)
Glucose-Capillary: 67 mg/dL — ABNORMAL LOW (ref 70–99)
Glucose-Capillary: 88 mg/dL (ref 70–99)
Glucose-Capillary: 160 mg/dL — ABNORMAL HIGH (ref 70–99)

## 2019-09-01 MED ORDER — INSULIN GLARGINE 100 UNIT/ML ~~LOC~~ SOLN
15.0000 [IU] | Freq: Every day | SUBCUTANEOUS | Status: DC
  Administered 2019-09-01: 15 [IU] via SUBCUTANEOUS
  Filled 2019-09-01 (×2): qty 0.15

## 2019-09-01 NOTE — Care Management Important Message (Signed)
Important Message  Patient Details  Name: William Sharp MRN: [id number] Date of Birth: 15-May-1941   Medicare Important Message Given:  Yes     Corey Harold 09/01/2019, 3:12 PM

## 2019-09-01 NOTE — Progress Notes (Signed)
PROGRESS NOTE [facility]   Kouper Spinella Pudwill  PTW:[id number]  DOB: 06/15/1940  DOA: 08/27/2019 PCP: Patient, No Pcp Per   Brief Admission Hx: 79 y.o. male with medical history significant of CAD, type 2 diabetes, diabetic peripheral neuropathy, hyperlipidemia, hypothyroidism, obesity, TIA who is brought to the emergency department via EMS after stopping to talk to his wife while he was in the parking lot ready to go to the pharmacy to pick up his insulin.  MDM/Assessment & Plan:   1. Acute CVA - full stroke work up completed.  PT recommending SNF.  Wife and patient agreeable to SNF.  Neurology consulted. Recommending DAPT x 30 days then aspirin daily afterwards.  Working to better control diabetes.  Added crestor 10 mg daily for uncontrolled LDL. 2. Acute delirium / sundowning - haldol IM ordered for severe agitation, increased seroquel 25 mg TID.      3. CAD - stable, will need follow up with Dr. Percival Spanish after discharge.  No chest pain symptoms. Crestor added for uncontrolled lipids.  4. HTN - permissive hypertension allowed. BPs stable and we follow closely.  5. Uncontrolled type 2 DM as evidenced by A1c of 11.2%.- titrating insulin doses for better glycemic control.  6. Heart failure reduced EF - compensated, remains on lasix 20 mg BID.   DVT prophylaxis: SCD Code Status: Full  Family Communication: unable to reach by telephone today, multiple calls unanswered Disposition Plan: He is stable to discharge to SNF when bed available.  As confusion clears up he should be able to DC to SNF in next 24-48 hours pending insurance authorization.  Consultants:  neurology  Procedures: 2D Echocardiogram IMPRESSIONS  1. Left ventricular ejection fraction, by estimation, is 40%. The left  ventricle has mild to moderately decreased function. The left ventricle  demonstrates regional wall motion abnormalities (see scoring  diagram/findings for description). The left  ventricular  internal cavity size was mildly dilated. There is severe left  ventricular hypertrophy. Left ventricular diastolic parameters are  consistent with Grade I diastolic dysfunction (impaired relaxation).  2. Right ventricular systolic function is normal. The right ventricular  size is normal. Tricuspid regurgitation signal is inadequate for assessing  PA pressure.  3. Left atrial size was mildly dilated.  4. The mitral valve is grossly normal. Trivial mitral valve  regurgitation.  5. The aortic valve is tricuspid. Aortic valve regurgitation is not  visualized. Mild to moderate aortic valve sclerosis/calcification is  present, without any evidence of aortic stenosis.  6. Aortic dilatation noted. There is mild dilatation of the ascending  aorta.  7. The inferior vena cava is normal in size with greater than 50%  respiratory variability, suggesting right atrial pressure of 3 mmHg.   IMPRESSION:  CAROTID DOPPLERS Color duplex indicates moderate heterogeneous and calcified plaque, with no hemodynamically significant stenosis by duplex criteria in the extracranial cerebrovascular circulation.  Signed,  Dulcy Fanny. Dellia Nims, RPVI  [facility]  [facility]   Electronically Signed   By: Corrie Mckusick D.O.   On: 08/28/2019 13:43  Antimicrobials:     Subjective: Pt much less agitated today.  Remains confused.         Objective: Vitals:   08/30/19 0358 08/31/19 1320 08/31/19 2106 09/01/19 1826  BP:  120/68 (!) 107/52 130/63  Pulse:  75 78 90  Resp:  18 16 20   Temp: 98 F (36.7 C) 98.1 F (36.7 C) 97.7 F (36.5 C)   TempSrc: Oral Oral  SpO2:  98% 98% 99%  Weight:      Height:        Intake/Output Summary (Last 24 hours) at 09/01/2019 1900 Last data filed at 09/01/2019 1700 Gross per 24 hour  Intake 360 ml  Output 1675 ml  Net -1315 ml   Filed Weights   08/27/19 1626  Weight: 108.9 kg   REVIEW OF  SYSTEMS  Actively confused  Exam:  General exam: elderly weak male, sitting up in bed, no apparent distress.  Respiratory system: Clear. No increased work of breathing. Cardiovascular system: normal S1 & S2 heard. No JVD, murmurs, gallops, clicks or pedal edema. Gastrointestinal system: Abdomen is nondistended, soft and nontender. Normal bowel sounds heard. Central nervous system: Alert and oriented to person only. No focal neurological deficits. Extremities: no CCE.  Data Reviewed: Basic Metabolic Panel: Recent Labs  Lab 08/27/19 1658 08/30/19 1258 08/31/19 1027  NA 136 139 138  K 4.5 3.3* 3.7  CL 103 101 98  CO2 25 28 28   GLUCOSE 397* 119* 208*  BUN 25* 19 22  CREATININE 1.18 1.02 1.12  CALCIUM 9.0 9.1 9.0  MG  --  2.0  --    Liver Function Tests: Recent Labs  Lab 08/27/19 1658  AST 12*  ALT 12  ALKPHOS 54  BILITOT 0.8  PROT 6.4*  ALBUMIN 3.6   No results for input(s): LIPASE, AMYLASE in the last 168 hours. No results for input(s): AMMONIA in the last 168 hours. CBC: Recent Labs  Lab 08/27/19 1658  WBC 5.7  NEUTROABS 4.3  HGB 13.4  HCT 41.1  MCV 92.2  PLT 254   Cardiac Enzymes: No results for input(s): CKTOTAL, CKMB, CKMBINDEX, TROPONINI in the last 168 hours. CBG (last 3)  Recent Labs    09/01/19 1633 09/01/19 1652 09/01/19 1717  GLUCAP 65* 67* 88   Recent Results (from the past 240 hour(s))  Respiratory Panel by RT PCR (Flu A&B, Covid) - Nasopharyngeal Swab     Status: None   Collection Time: 08/27/19  6:27 PM   Specimen: Nasopharyngeal Swab  Result Value Ref Range Status   SARS Coronavirus 2 by RT PCR NEGATIVE NEGATIVE Final    Comment: (NOTE) SARS-CoV-2 target nucleic acids are NOT DETECTED. The SARS-CoV-2 RNA is generally detectable in upper respiratoy specimens during the acute phase of infection. The lowest concentration of SARS-CoV-2 viral copies this assay can detect is 131 copies/mL. A negative result does not preclude  SARS-Cov-2 infection and should not be used as the sole basis for treatment or other patient management decisions. A negative result may occur with  improper specimen collection/handling, submission of specimen other than nasopharyngeal swab, presence of viral mutation(s) within the areas targeted by this assay, and inadequate number of viral copies (<131 copies/mL). A negative result must be combined with clinical observations, patient history, and epidemiological information. The expected result is Negative. Fact Sheet for Patients:  08/29/19 Fact Sheet for Healthcare Providers:  https://www.moore.com/ This test is not yet ap proved or cleared by the https://www.young.biz/ FDA and  has been authorized for detection and/or diagnosis of SARS-CoV-2 by FDA under an Emergency Use Authorization (EUA). This EUA will remain  in effect (meaning this test can be used) for the duration of the COVID-19 declaration under Section 564(b)(1) of the Act, 21 U.S.C. section 360bbb-3(b)(1), unless the authorization is terminated or revoked sooner.    Influenza A by PCR NEGATIVE NEGATIVE Final   Influenza B by PCR NEGATIVE NEGATIVE Final  Comment: (NOTE) The Xpert Xpress SARS-CoV-2/FLU/RSV assay is intended as an aid in  the diagnosis of influenza from Nasopharyngeal swab specimens and  should not be used as a sole basis for treatment. Nasal washings and  aspirates are unacceptable for Xpert Xpress SARS-CoV-2/FLU/RSV  testing. Fact Sheet for Patients: https://www.moore.com/ Fact Sheet for Healthcare Providers: https://www.young.biz/ This test is not yet approved or cleared by the [place] FDA and  has been authorized for detection and/or diagnosis of SARS-CoV-2 by  FDA under an Emergency Use Authorization (EUA). This EUA will remain  in effect (meaning this test can be used) for the duration of the  Covid-19  declaration under Section 564(b)(1) of the Act, 21  U.S.C. section 360bbb-3(b)(1), unless the authorization is  terminated or revoked. Performed at [facility], [address], [place], [place] [address]      Studies: No results found.   Scheduled Meds: . aspirin  300 mg Rectal Daily   Or  . aspirin  325 mg Oral Daily  . clopidogrel  75 mg Oral Q breakfast  . furosemide  20 mg Oral BID  . insulin aspart  0-15 Units Subcutaneous TID WC  . insulin aspart  0-5 Units Subcutaneous QHS  . insulin aspart  5 Units Subcutaneous TID WC  . insulin glargine  15 Units Subcutaneous Daily  . QUEtiapine  25 mg Oral TID  . rosuvastatin  10 mg Oral QPM   Continuous Infusions:  Principal Problem:   Acute CVA (cerebrovascular accident) (HCC) Active Problems:   CAD (coronary artery disease)   HTN (hypertension), benign   Diabetic neuropathy (HCC)   Hyperlipemia   Obesity   Other specified hypothyroidism   Type 2 diabetes mellitus (HCC)   Time spent:   Standley Dakins, MD [facility] 09/01/2019, 7:00 PM    LOS: 4 days  How to contact the [facility] Attending or Consulting provider 7A - 7P or covering provider during after hours 7P -7A, for this patient?  1. Check the care team in [facility] and look for a) attending/consulting TRH provider listed and b) the [facility] team listed 2. Log into [email or web address] and use [place]'s universal password to access. If you do not have the password, please contact the hospital operator. 3. Locate the [facility] provider you are looking for under [facility] and page to a number that you can be directly reached. 4. If you still have difficulty reaching the provider, please page the [facility] (Director on Call) for the Hospitalists listed on amion for assistance.

## 2019-09-02 LAB — GLUCOSE, CAPILLARY
Glucose-Capillary: 135 mg/dL — ABNORMAL HIGH (ref 70–99)
Glucose-Capillary: 126 mg/dL — ABNORMAL HIGH (ref 70–99)
Glucose-Capillary: 105 mg/dL — ABNORMAL HIGH (ref 70–99)
Glucose-Capillary: 117 mg/dL — ABNORMAL HIGH (ref 70–99)
Glucose-Capillary: 128 mg/dL — ABNORMAL HIGH (ref 70–99)
Glucose-Capillary: 89 mg/dL (ref 70–99)

## 2019-09-02 MED ORDER — INSULIN GLARGINE 100 UNIT/ML ~~LOC~~ SOLN
10.0000 [IU] | Freq: Every day | SUBCUTANEOUS | Status: DC
  Administered 2019-09-02 – 2019-09-03 (×2): 10 [IU] via SUBCUTANEOUS
  Filled 2019-09-02 (×4): qty 0.1

## 2019-09-02 MED ORDER — INSULIN ASPART 100 UNIT/ML ~~LOC~~ SOLN
3.0000 [IU] | Freq: Three times a day (TID) | SUBCUTANEOUS | Status: DC
  Administered 2019-09-02 – 2019-09-03 (×4): 3 [IU] via SUBCUTANEOUS

## 2019-09-02 MED ORDER — INSULIN GLARGINE 100 UNIT/ML ~~LOC~~ SOLN
12.0000 [IU] | Freq: Every day | SUBCUTANEOUS | Status: DC
  Filled 2019-09-02 (×2): qty 0.12

## 2019-09-02 MED ORDER — INSULIN ASPART 100 UNIT/ML ~~LOC~~ SOLN
4.0000 [IU] | Freq: Three times a day (TID) | SUBCUTANEOUS | Status: DC
  Administered 2019-09-02: 4 [IU] via SUBCUTANEOUS

## 2019-09-02 MED ORDER — QUETIAPINE FUMARATE 25 MG PO TABS: 50.0000 mg | ORAL_TABLET | Freq: Every day | ORAL | Status: DC

## 2019-09-02 MED ORDER — QUETIAPINE FUMARATE 25 MG PO TABS
25.0000 mg | ORAL_TABLET | Freq: Two times a day (BID) | ORAL | Status: DC
  Administered 2019-09-03: 25 mg via ORAL
  Filled 2019-09-02 (×2): qty 1

## 2019-09-02 NOTE — Progress Notes (Signed)
PT Cancellation Note  Patient Details Name: William Sharp MRN: [id number] DOB: 1941/05/14   Cancelled Treatment:    Reason Eval/Treat Not Completed: Fatigue/lethargy limiting ability to participate;Patient's level of consciousness;Other (comment)(Patient level of arousal limiting ability to participate) Patient sleeping upon entrance for session and wakes when addressed. Does not respond to questions or acknowlege requests and does not participate with physical therapy.    12:00 PM, 09/02/19 [name] PT, DPT Physical Therapist at [facility]

## 2019-09-02 NOTE — Progress Notes (Signed)
Has been nonverbal all day.  When asked name, just makes eye contact but does not answer questions.   Will follow some commands such as hand grip, though weakly.  Drift in both arms and no effort against gravity with legs.  Swallowed medications with no difficulty and ate about 50% of lunch when fed.  Held bread in hand and fed self that and drank from straw with prompting. Wife came during lunch. Mitts removed at 1200 as patient has been calm

## 2019-09-02 NOTE — Progress Notes (Signed)
PROGRESS NOTE [facility]   William Sharp  JHE:[id number]  DOB: 10/05/1940  DOA: 08/27/2019 PCP: Patient, No Pcp Per  Brief Admission Hx: 79 y.o. male with medical history significant of CAD, type 2 diabetes, diabetic peripheral neuropathy, hyperlipidemia, hypothyroidism, obesity, TIA who is brought to the emergency department via EMS after stopping to talk to his wife while he was in the parking lot ready to go to the pharmacy to pick up his insulin.  MDM/Assessment & Plan:   1. Acute CVA - full stroke work up completed.  PT recommending SNF.  Wife and patient agreeable to SNF.  Neurology consulted. Recommending DAPT x 30 days then aspirin daily afterwards.  Working to better control diabetes.  Added crestor 10 mg daily for uncontrolled LDL. 2. Acute delirium / sundowning - IMPROVING.  He has been on seroquel 25 mg BID and increasing to 50 mg at HS.      3. CAD - stable, will need follow up with Dr. Antoine Poche after discharge.  No chest pain symptoms. Crestor added for uncontrolled lipids.  4. HTN - permissive hypertension allowed. BPs stable and we follow closely.  5. Uncontrolled type 2 DM as evidenced by A1c of 11.2%.- titrating insulin doses for better glycemic control.  6. Heart failure reduced EF - compensated, remains on lasix 20 mg BID.   DVT prophylaxis: SCD Code Status: Full  Family Communication: unable to reach by telephone today, multiple calls unanswered Disposition Plan: He is stable to discharge to SNF when bed available.  His confusion is markedly impoved, he should be able to DC to SNF in next 24-48 hours pending insurance authorization.  Consultants:  neurology  Procedures: 2D Echocardiogram IMPRESSIONS  1. Left ventricular ejection fraction, by estimation, is 40%. The left  ventricle has mild to moderately decreased function. The left ventricle  demonstrates regional wall motion abnormalities (see scoring  diagram/findings for description). The left   ventricular internal cavity size was mildly dilated. There is severe left  ventricular hypertrophy. Left ventricular diastolic parameters are  consistent with Grade I diastolic dysfunction (impaired relaxation).  2. Right ventricular systolic function is normal. The right ventricular  size is normal. Tricuspid regurgitation signal is inadequate for assessing  PA pressure.  3. Left atrial size was mildly dilated.  4. The mitral valve is grossly normal. Trivial mitral valve  regurgitation.  5. The aortic valve is tricuspid. Aortic valve regurgitation is not  visualized. Mild to moderate aortic valve sclerosis/calcification is  present, without any evidence of aortic stenosis.  6. Aortic dilatation noted. There is mild dilatation of the ascending  aorta.  7. The inferior vena cava is normal in size with greater than 50%  respiratory variability, suggesting right atrial pressure of 3 mmHg.   IMPRESSION:  CAROTID DOPPLERS Color duplex indicates moderate heterogeneous and calcified plaque, with no hemodynamically significant stenosis by duplex criteria in the extracranial cerebrovascular circulation.  Signed,  Yvone Neu. Reyne Dumas, RPVI  [facility]  [facility]   Electronically Signed   By: Gilmer Mor D.O.   On: 08/28/2019 13:43  Antimicrobials:     Subjective: Pt eating breakfast with the nursing tech today.  Much more cooperative.           Objective: Vitals:   09/01/19 1826 09/01/19 2106 09/01/19 2128 09/02/19 0543  BP: 130/63  (!) 127/113 113/66  Pulse: 90  97 98  Resp: 20  19 18   Temp:   98.6 F (37 C)  98.6 F (37 C)  TempSrc:   Oral Oral  SpO2: 99% 97% 96% 100%  Weight:      Height:        Intake/Output Summary (Last 24 hours) at 09/02/2019 1151 Last data filed at 09/02/2019 2979 Gross per 24 hour  Intake 240 ml  Output 2300 ml  Net -2060 ml   Filed Weights   08/27/19 1626  Weight: 108.9 kg    REVIEW OF SYSTEMS  Actively confused  Exam:  General exam: elderly weak male, sitting up in bed, no apparent distress.  Respiratory system: Clear. No increased work of breathing. Cardiovascular system: normal S1 & S2 heard. No JVD, murmurs, gallops, clicks or pedal edema. Gastrointestinal system: Abdomen is nondistended, soft and nontender. Normal bowel sounds heard. Central nervous system: Alert and oriented to person only. No focal neurological deficits. Extremities: no CCE.  Data Reviewed: Basic Metabolic Panel: Recent Labs  Lab 08/27/19 1658 08/30/19 1258 08/31/19 1027  NA 136 139 138  K 4.5 3.3* 3.7  CL 103 101 98  CO2 25 28 28   GLUCOSE 397* 119* 208*  BUN 25* 19 22  CREATININE 1.18 1.02 1.12  CALCIUM 9.0 9.1 9.0  MG  --  2.0  --    Liver Function Tests: Recent Labs  Lab 08/27/19 1658  AST 12*  ALT 12  ALKPHOS 54  BILITOT 0.8  PROT 6.4*  ALBUMIN 3.6   No results for input(s): LIPASE, AMYLASE in the last 168 hours. No results for input(s): AMMONIA in the last 168 hours. CBC: Recent Labs  Lab 08/27/19 1658  WBC 5.7  NEUTROABS 4.3  HGB 13.4  HCT 41.1  MCV 92.2  PLT 254   Cardiac Enzymes: No results for input(s): CKTOTAL, CKMB, CKMBINDEX, TROPONINI in the last 168 hours. CBG (last 3)  Recent Labs    09/02/19 0428 09/02/19 0743 09/02/19 1124  GLUCAP 135* 126* 105*   Recent Results (from the past 240 hour(s))  Respiratory Panel by RT PCR (Flu A&B, Covid) - Nasopharyngeal Swab     Status: None   Collection Time: 08/27/19  6:27 PM   Specimen: Nasopharyngeal Swab  Result Value Ref Range Status   SARS Coronavirus 2 by RT PCR NEGATIVE NEGATIVE Final    Comment: (NOTE) SARS-CoV-2 target nucleic acids are NOT DETECTED. The SARS-CoV-2 RNA is generally detectable in upper respiratoy specimens during the acute phase of infection. The lowest concentration of SARS-CoV-2 viral copies this assay can detect is 131 copies/mL. A negative result does not  preclude SARS-Cov-2 infection and should not be used as the sole basis for treatment or other patient management decisions. A negative result may occur with  improper specimen collection/handling, submission of specimen other than nasopharyngeal swab, presence of viral mutation(s) within the areas targeted by this assay, and inadequate number of viral copies (<131 copies/mL). A negative result must be combined with clinical observations, patient history, and epidemiological information. The expected result is Negative. Fact Sheet for Patients:  PinkCheek.be Fact Sheet for Healthcare Providers:  GravelBags.it This test is not yet ap proved or cleared by the [place] FDA and  has been authorized for detection and/or diagnosis of SARS-CoV-2 by FDA under an Emergency Use Authorization (EUA). This EUA will remain  in effect (meaning this test can be used) for the duration of the COVID-19 declaration under Section 564(b)(1) of the Act, 21 U.S.C. section 360bbb-3(b)(1), unless the authorization is terminated or revoked sooner.    Influenza A by PCR NEGATIVE NEGATIVE  Final   Influenza B by PCR NEGATIVE NEGATIVE Final    Comment: (NOTE) The Xpert Xpress SARS-CoV-2/FLU/RSV assay is intended as an aid in  the diagnosis of influenza from Nasopharyngeal swab specimens and  should not be used as a sole basis for treatment. Nasal washings and  aspirates are unacceptable for Xpert Xpress SARS-CoV-2/FLU/RSV  testing. Fact Sheet for Patients: https://www.moore.com/ Fact Sheet for Healthcare Providers: https://www.young.biz/ This test is not yet approved or cleared by the [place] FDA and  has been authorized for detection and/or diagnosis of SARS-CoV-2 by  FDA under an Emergency Use Authorization (EUA). This EUA will remain  in effect (meaning this test can be used) for the duration of the    Covid-19 declaration under Section 564(b)(1) of the Act, 21  U.S.C. section 360bbb-3(b)(1), unless the authorization is  terminated or revoked. Performed at [facility], [address], [place], [place] [address]      Studies: No results found.   Scheduled Meds: . aspirin  300 mg Rectal Daily   Or  . aspirin  325 mg Oral Daily  . clopidogrel  75 mg Oral Q breakfast  . furosemide  20 mg Oral BID  . insulin aspart  0-15 Units Subcutaneous TID WC  . insulin aspart  0-5 Units Subcutaneous QHS  . insulin aspart  3 Units Subcutaneous TID WC  . insulin glargine  10 Units Subcutaneous Daily  . QUEtiapine  25 mg Oral BID  . QUEtiapine  50 mg Oral QHS  . rosuvastatin  10 mg Oral QPM   Continuous Infusions:  Principal Problem:   Acute CVA (cerebrovascular accident) (HCC) Active Problems:   CAD (coronary artery disease)   HTN (hypertension), benign   Diabetic neuropathy (HCC)   Hyperlipemia   Obesity   Other specified hypothyroidism   Type 2 diabetes mellitus (HCC)   Time spent:   Standley Dakins, MD [facility] 09/02/2019, 11:51 AM    LOS: 5 days  How to contact the [facility] Attending or Consulting provider 7A - 7P or covering provider during after hours 7P -7A, for this patient?  1. Check the care team in [facility] and look for a) attending/consulting TR[facility] provider listed and b) the [facility] team listed 2. Log into [email or web address] and use 's universal password to access. If you do not have the password, please contact the hospital operator. 3. Locate the [facility] provider you are looking for under [facility] and page to a number that you can be directly reached. 4. If you still have difficulty reaching the provider, please page the [facility] (Director on Call) for the Hospitalists listed on amion for assistance.

## 2019-09-02 NOTE — Progress Notes (Signed)
09/02/2019 5:52 PM  Pt has been sleepy most of the day. Will cut back on seroquel dose.  DC nighttime dose.  Maryln Manuel MD

## 2019-09-03 LAB — GLUCOSE, CAPILLARY
Glucose-Capillary: 91 mg/dL (ref 70–99)
Glucose-Capillary: 81 mg/dL (ref 70–99)
Glucose-Capillary: 111 mg/dL — ABNORMAL HIGH (ref 70–99)
Glucose-Capillary: 66 mg/dL — ABNORMAL LOW (ref 70–99)

## 2019-09-03 MED ORDER — QUETIAPINE FUMARATE 25 MG PO TABS: 25.0000 mg | ORAL_TABLET | Freq: Two times a day (BID) | ORAL | 0 refills | Status: AC

## 2019-09-03 MED ORDER — QUETIAPINE FUMARATE 25 MG PO TABS: 25.0000 mg | ORAL_TABLET | Freq: Every day | ORAL | Status: DC

## 2019-09-03 MED ORDER — ASPIRIN 325 MG PO TABS: 325.0000 mg | ORAL_TABLET | Freq: Every day | ORAL | 3 refills | Status: AC

## 2019-09-03 MED ORDER — FUROSEMIDE 20 MG PO TABS: 20.0000 mg | ORAL_TABLET | Freq: Two times a day (BID) | ORAL | 0 refills | Status: AC

## 2019-09-03 MED ORDER — ROSUVASTATIN CALCIUM 10 MG PO TABS: 10.0000 mg | ORAL_TABLET | Freq: Every evening | ORAL | 3 refills | Status: AC

## 2019-09-03 MED ORDER — CLOPIDOGREL BISULFATE 75 MG PO TABS: 75.0000 mg | ORAL_TABLET | Freq: Every day | ORAL | 0 refills | Status: AC

## 2019-09-03 NOTE — Progress Notes (Signed)
Physical Therapy Treatment Patient Details Name: Lamarco Gudiel MRN: [id number] DOB: 1940-11-24 Today's Date: 09/03/2019    History of Present Illness Deryk Bozman is a 79 y.o. male with medical history significant of CAD, type 2 diabetes, diabetic peripheral neuropathy, hyperlipidemia, hypothyroidism, obesity, TIA who is brought to the emergency department via EMS after stopping to talk to his wife while he was in the parking lot ready to go to the pharmacy to pick up his insulin.  He denies losing consciousness, but states that this sometimes happens when his glucose is elevated.  By the time the ambulance crew arrived, his symptoms have resolved.  His CBG on the scene was 411 mg/dL.  EMS gave him 500 mL of NS.  He denies headache, blurred vision, polyuria, polydipsia, but complains of recently worsening nocturia.  No fever, chills, rhinorrhea, sore throat, dyspnea, cough, wheezing or hemoptysis.  He denies chest pain, palpitations, dizziness, diaphoresis, PND, orthopnea or pitting edema of the lower extremities.  No abdominal pain, diarrhea, constipation, melena or hematochezia.  No dysuria, frequency or hematuria.    PT Comments    Patient presents alert and cooperative, but non-verbal most of time.  Patient demonstrates slow labored movement for sitting up at bedside, unable to maintain sitting balance with frequent leaning/falling over to the right, unable to complete sit to stand using RW due to weakness, limited to lifting bottom off bed slightly with Max hand held assist when laterally scooting and put back to bed in side lying position after therapy.  Patient will benefit from continued physical therapy in hospital and recommended venue below to increase strength, balance, endurance for safe ADLs and gait.    Follow Up Recommendations  SNF;Supervision for mobility/OOB;Supervision - Intermittent     Equipment Recommendations  None recommended by PT    Recommendations for Other  Services       Precautions / Restrictions Precautions Precautions: Fall Restrictions Weight Bearing Restrictions: No    Mobility  Bed Mobility Overal bed mobility: Needs Assistance Bed Mobility: Supine to Sit;Sit to Supine     Supine to sit: Mod assist;Max assist Sit to supine: Mod assist;Max assist   General bed mobility comments: slow labored movement  Transfers Overall transfer level: Needs assistance Equipment used: Rolling walker (2 wheeled) Transfers: Sit to/from Stand Sit to Stand: Max assist         General transfer comment: unable to fully stand due to weakness  Ambulation/Gait                 Stairs             Wheelchair Mobility    Modified Rankin (Stroke Patients Only)       Balance   Sitting-balance support: Feet supported;Bilateral upper extremity supported Sitting balance-Leahy Scale: Poor Sitting balance - Comments: frequent falling over to the right while seated at EOB                                    Cognition Arousal/Alertness: Awake/alert Behavior During Therapy: WFL for tasks assessed/performed;Flat affect Overall Cognitive Status: History of cognitive impairments - at baseline                                        Exercises General Exercises - Lower Extremity Ankle Circles/Pumps: Seated;AROM;Both;5 reps  General Comments        Pertinent Vitals/Pain Pain Assessment: No/denies pain    Home Living                      Prior Function            PT Goals (current goals can now be found in the care plan section) Acute Rehab PT Goals Patient Stated Goal: return home with family to assist PT Goal Formulation: With patient Time For Goal Achievement: 09/11/19 Potential to Achieve Goals: Fair Progress towards PT goals: Progressing toward goals    Frequency    Min 3X/week      PT Plan Current plan remains appropriate    Co-evaluation               AM-PAC PT "6 Clicks" Mobility   Outcome Measure  Help needed turning from your back to your side while in a flat bed without using bedrails?: A Lot Help needed moving from lying on your back to sitting on the side of a flat bed without using bedrails?: A Lot Help needed moving to and from a bed to a chair (including a wheelchair)?: Total Help needed standing up from a chair using your arms (e.g., wheelchair or bedside chair)?: Total Help needed to walk in hospital room?: Total Help needed climbing 3-5 steps with a railing? : Total 6 Click Score: 8    End of Session   Activity Tolerance: Patient tolerated treatment well;Patient limited by fatigue Patient left: in bed;with call bell/phone within reach;with bed alarm set Nurse Communication: Mobility status PT Visit Diagnosis: Unsteadiness on feet (R26.81);Other abnormalities of gait and mobility (R26.89);Muscle weakness (generalized) (M62.81)     Time: 3244-0102 PT Time Calculation (min) (ACUTE ONLY): 27 min  Charges:  $Therapeutic Activity: 23-37 mins                     11:35 AM, 09/03/19 Lonell Grandchild, MPT Physical Therapist with [facility] [phone] office [phone] mobile phone

## 2019-09-03 NOTE — Discharge Summary (Signed)
Physician Discharge Summary  William Sharp WCB:[id number] DOB: Apr 06, 1941 DOA: 08/27/2019  PCP: Patient, No Pcp Per  Admit date: 08/27/2019  Discharge date: 09/03/2019  Admitted From:Home  Disposition:  SNF  Recommendations for Outpatient Follow-up:  1. Follow up with PCP in 1-2 weeks 2. Follow-up with Dr. Gerilyn Pilgrim with neurology in 4 weeks 3. Continue on dual antiplatelet therapy as prescribed for 30 days and then aspirin 325 mg daily thereafter 4. Continue on Crestor 10 mg daily as prescribed 5. Patient to receive outpatient cardiac monitor to assess for atrial fibrillation 6. Continue Seroquel 25 mg twice daily for sundowning/agitation and delirium  Home Health: None  Equipment/Devices: None  Discharge Condition: Stable  CODE STATUS: Full  Diet recommendation: Heart Healthy/carb modified  Brief/Interim Summary: 79 y.o.malewith medical history significant ofCAD, type 2 diabetes, diabetic peripheral neuropathy, hyperlipidemia, hypothyroidism, obesity, TIA who is brought to the emergency department via EMS after stopping to talk to his wife while he was in the parking lot ready to go to the pharmacy to pick up his insulin.  1. Acute CVA - full stroke work up completed.  PT recommending SNF.  Wife and patient agreeable to SNF.  Neurology consulted. Recommending DAPT x 30 days then aspirin daily afterwards.  Working to better control diabetes.  Added crestor 10 mg daily for uncontrolled LDL. 2. Acute delirium / sundowning -resolved.  He has been on seroquel 25 mg BID and is tolerating this dose well.  Any increase in doses seems to cause sedation. 3. CAD - stable, will need follow up with Dr. Antoine Poche after discharge.  No chest pain symptoms. Crestor added for uncontrolled lipids.  4. HTN - permissive hypertension allowed. BPs stable and we follow closely.  5. Uncontrolled type 2 DM as evidenced by A1c of 11.2%.- titrating insulin doses for better glycemic control.  6. Heart  failure reduced EF - compensated, remains on lasix 20 mg BID.   Discharge Diagnoses:  Principal Problem:   Acute CVA (cerebrovascular accident) [facility]) Active Problems:   CAD (coronary artery disease)   HTN (hypertension), benign   Diabetic neuropathy (HCC)   Hyperlipemia   Obesity   Other specified hypothyroidism   Type 2 diabetes mellitus (HCC)  Principal discharge diagnosis: Acute aphasia due to left hemispheric watershed infarct.  Discharge Instructions  Discharge Instructions    Diet - low sodium heart healthy   Complete by: As directed    Increase activity slowly   Complete by: As directed      Allergies as of 09/03/2019   No Known Allergies     Medication List    TAKE these medications   aspirin 325 MG tablet Take 1 tablet (325 mg total) by mouth daily. Start taking on: September 04, 2019   clopidogrel 75 MG tablet Commonly known as: PLAVIX Take 1 tablet (75 mg total) by mouth daily with breakfast. Start taking on: September 04, 2019   furosemide 20 MG tablet Commonly known as: LASIX Take 1 tablet (20 mg total) by mouth 2 (two) times daily.   Lantus SoloStar 100 UNIT/ML Solostar Pen Generic drug: insulin glargine Inject 40 Units into the skin daily.   metFORMIN 500 MG tablet Commonly known as: GLUCOPHAGE Take 500 mg by mouth 2 (two) times daily with a meal.   nitroGLYCERIN 0.4 MG SL tablet Commonly known as: NITROSTAT Place 1 tablet (0.4 mg total) under the tongue every 5 (five) minutes as needed.   QUEtiapine 25 MG tablet Commonly known as: SEROQUEL Take 1 tablet (  25 mg total) by mouth 2 (two) times daily.   rosuvastatin 10 MG tablet Commonly known as: CRESTOR Take 1 tablet (10 mg total) by mouth every evening.       Contact information for follow-up providers    [facility] Follow up.   Specialty: Home Health Services Why:  PT Contact information: [address] [place] [address] [phone]        Beryle Beams, MD Follow  up in 4 week(s).   Specialty: Neurology Contact information: [address] [place] [address] [phone]            Contact information for after-discharge care    Destination    [facility] Preferred SNF .   Service: Skilled Nursing Contact information: [address] [place] [address] [phone]                 No Known Allergies  Consultations:  Neurology   Procedures/Studies: CT HEAD WO CONTRAST  Result Date: 08/27/2019 CLINICAL DATA:  TIA EXAM: CT HEAD WITHOUT CONTRAST TECHNIQUE: Contiguous axial images were obtained from the base of the skull through the vertex without intravenous contrast. COMPARISON:  CT head January 03, 2018 FINDINGS: Brain: Remote right posterior temporal and left parietal infarcts are unchanged from comparison exam. Few remote appearing lacunar type infarcts are noted in the bilateral basal ganglia. No evidence of acute infarction, hemorrhage, hydrocephalus, extra-axial collection or mass lesion/mass effect. Symmetric prominence of the ventricles, cisterns and sulci compatible with parenchymal volume loss. Patchy areas of white matter hypoattenuation are most compatible with chronic microvascular angiopathy. Faint senescent mineralization of the basal ganglia is noted. Vascular: Atherosclerotic calcification of the carotid siphons and intradural vertebral arteries. No hyperdense vessel. Skull: Chronic soft tissue thickening of the posterior left high parietal scalp may reflect an area of scarring. No calvarial fracture or suspicious osseous lesion. No scalp swelling or hematoma. Sinuses/Orbits: Minimal mural thickening in the ethmoid sinuses. Remaining paranasal sinuses and mastoid air cells are predominantly clear. Small amount of debris in the right external auditory canal may reflect cerumen. Orbital structures are unremarkable aside from prior lens extractions. Other: None IMPRESSION: 1. No acute intracranial  abnormality. 2. Remote right posterior temporal and left parietal infarcts. Chronic appearing a basal ganglia lacunar infarcts as well. 3. Chronic microvascular angiopathy and parenchymal volume loss. Electronically Signed   By: Kreg Shropshire M.D.   On: 08/27/2019 17:37   MR ANGIO HEAD WO CONTRAST  Result Date: 08/28/2019 CLINICAL DATA:  Abnormal speech EXAM: MRI HEAD WITHOUT CONTRAST MRA HEAD WITHOUT CONTRAST TECHNIQUE: Multiplanar, multiecho pulse sequences of the brain and surrounding structures were obtained without intravenous contrast. Angiographic images of the head were obtained using MRA technique without contrast. COMPARISON:  None. FINDINGS: MRI HEAD Brain: There are small foci of restricted diffusion in the parasagittal left frontoparietal lobes. Chronic infarcts are present the right occipitotemporal lobes, right occipital lobe abutting the parieto-occipital sulcus, and left superior parietal lobule. Chronic small vessel infarcts the basal ganglia bilaterally and right thalamus. There are chronic blood products associated with the right occipitotemporal infarct. There is prominence of the ventricles and sulci reflecting generalized parenchymal volume loss with superimposed ex vacuo ventricular dilatation adjacent to infarcts. Additional patchy T2 hyperintensity in the supratentorial white matter is nonspecific but probably reflects mild chronic microvascular ischemic changes. There is no hydrocephalus or extra-axial fluid collection. Vascular: Major vessel flow voids at the skull base are preserved. Skull and upper cervical  spine: Normal marrow signal is preserved. Sinuses/Orbits: Minor mucosal thickening. No significant orbital finding. Other: Sella is unremarkable.  Mastoid air cells are clear. MRA HEAD Intracranial internal carotid arteries are patent with atherosclerotic irregularity. Middle cerebral arteries are patent with mild distal stenosis. Proximal anterior cerebral arteries are patent.  There is high-grade stenosis of the precallosal left ACA. Additional high-grade proximal supracallosal right ACA stenosis. Intracranial vertebral arteries, basilar artery, posterior cerebral arteries are patent. There is no aneurysm. IMPRESSION: Few small acute infarcts in the left ACA territory. High-grade stenosis of the precallosal left ACA. Chronic infarcts and additional intracranial atherosclerosis detailed above. Electronically Signed   By: Guadlupe Spanish M.D.   On: 08/28/2019 12:00   MR BRAIN WO CONTRAST  Result Date: 08/28/2019 CLINICAL DATA:  Abnormal speech EXAM: MRI HEAD WITHOUT CONTRAST MRA HEAD WITHOUT CONTRAST TECHNIQUE: Multiplanar, multiecho pulse sequences of the brain and surrounding structures were obtained without intravenous contrast. Angiographic images of the head were obtained using MRA technique without contrast. COMPARISON:  None. FINDINGS: MRI HEAD Brain: There are small foci of restricted diffusion in the parasagittal left frontoparietal lobes. Chronic infarcts are present the right occipitotemporal lobes, right occipital lobe abutting the parieto-occipital sulcus, and left superior parietal lobule. Chronic small vessel infarcts the basal ganglia bilaterally and right thalamus. There are chronic blood products associated with the right occipitotemporal infarct. There is prominence of the ventricles and sulci reflecting generalized parenchymal volume loss with superimposed ex vacuo ventricular dilatation adjacent to infarcts. Additional patchy T2 hyperintensity in the supratentorial white matter is nonspecific but probably reflects mild chronic microvascular ischemic changes. There is no hydrocephalus or extra-axial fluid collection. Vascular: Major vessel flow voids at the skull base are preserved. Skull and upper cervical spine: Normal marrow signal is preserved. Sinuses/Orbits: Minor mucosal thickening. No significant orbital finding. Other: Sella is unremarkable.  Mastoid air  cells are clear. MRA HEAD Intracranial internal carotid arteries are patent with atherosclerotic irregularity. Middle cerebral arteries are patent with mild distal stenosis. Proximal anterior cerebral arteries are patent. There is high-grade stenosis of the precallosal left ACA. Additional high-grade proximal supracallosal right ACA stenosis. Intracranial vertebral arteries, basilar artery, posterior cerebral arteries are patent. There is no aneurysm. IMPRESSION: Few small acute infarcts in the left ACA territory. High-grade stenosis of the precallosal left ACA. Chronic infarcts and additional intracranial atherosclerosis detailed above. Electronically Signed   By: Guadlupe Spanish M.D.   On: 08/28/2019 12:00   US Carotid Bilateral (at [facility] and [facility] only)  Result Date: 08/28/2019 CLINICAL DATA:  79 year old male with a history of TIA EXAM: BILATERAL CAROTID DUPLEX ULTRASOUND TECHNIQUE: Wallace Cullens scale imaging, color Doppler and duplex ultrasound were performed of bilateral carotid and vertebral arteries in the neck. COMPARISON:  None. FINDINGS: Criteria: Quantification of carotid stenosis is based on velocity parameters that correlate the residual internal carotid diameter with NASCET-based stenosis levels, using the diameter of the distal internal carotid lumen as the denominator for stenosis measurement. The following velocity measurements were obtained: RIGHT ICA:  Systolic 77 cm/sec, Diastolic 17 cm/sec CCA:  80 cm/sec SYSTOLIC ICA/CCA RATIO:  0.96 ECA:  219 cm/sec LEFT ICA:  Systolic 75 cm/sec, Diastolic 19 cm/sec CCA:  107 cm/sec SYSTOLIC ICA/CCA RATIO:  0.7 ECA:  174 cm/sec Right Brachial SBP: Not acquired Left Brachial SBP: Not acquired RIGHT CAROTID ARTERY: No significant calcifications of the right common carotid artery. Intermediate waveform maintained. Moderate heterogeneous and partially calcified plaque at the right carotid bifurcation. No significant lumen shadowing. Low resistance waveform  of the right  ICA. No significant tortuosity. RIGHT VERTEBRAL ARTERY: Antegrade flow with low resistance waveform. LEFT CAROTID ARTERY: No significant calcifications of the left common carotid artery. Intermediate waveform maintained. Moderate heterogeneous and partially calcified plaque at the left carotid bifurcation. No significant lumen shadowing. Low resistance waveform of the left ICA. No significant tortuosity. LEFT VERTEBRAL ARTERY:  Antegrade flow with low resistance waveform. IMPRESSION: Color duplex indicates moderate heterogeneous and calcified plaque, with no hemodynamically significant stenosis by duplex criteria in the extracranial cerebrovascular circulation. Signed, Dulcy Fanny. Dellia Nims, RPVI [facility] Electronically Signed   By: Corrie Mckusick D.O.   On: 08/28/2019 13:43   DG CHEST PORT 1 VIEW  Result Date: 08/27/2019 CLINICAL DATA:  TIA EXAM: PORTABLE CHEST 1 VIEW COMPARISON:  01/03/2018 FINDINGS: Post sternotomy changes. Minimal scarring or atelectasis left base. No consolidation, pleural effusion, or pneumothorax. Mild cardiomegaly with aortic atherosclerosis. Retrocardiac opacity possibly due to hiatal hernia. IMPRESSION: 1. Cardiomegaly without edema or focal airspace disease 2. Possible hiatal hernia Electronically Signed   By: Donavan Foil M.D.   On: 08/27/2019 19:45   ECHOCARDIOGRAM COMPLETE  Result Date: 08/28/2019    ECHOCARDIOGRAM REPORT   Patient Name:   William Sharp Rasnic Date of Exam: 08/28/2019 Medical Rec #:  [id number]          Height:       72.0 in Accession #:    [id number]         Weight:       240.0 lb Date of Birth:  April 08, 1941         BSA:          2.302 m Patient Age:    53 years           BP:           127/74 mmHg Patient Gender: M                  HR:           9 bpm. Exam Location:  [facility] Procedure: 2D Echo Indications:    TIA 435.9 / G45.9  History:        Patient has no prior history of Echocardiogram  examinations.                 CAD, TIA; Risk Factors:Hypertension, Dyslipidemia, Diabetes and                 Former Smoker.  Sonographer:    Leavy Cella RDCS (AE) Referring Phys: [id number] DAVID MANUEL [place]  1. Left ventricular ejection fraction, by estimation, is 40%. The left ventricle has mild to moderately decreased function. The left ventricle demonstrates regional wall motion abnormalities (see scoring diagram/findings for description). The left ventricular internal cavity size was mildly dilated. There is severe left ventricular hypertrophy. Left ventricular diastolic parameters are consistent with Grade I diastolic dysfunction (impaired relaxation).  2. Right ventricular systolic function is normal. The right ventricular size is normal. Tricuspid regurgitation signal is inadequate for assessing PA pressure.  3. Left atrial size was mildly dilated.  4. The mitral valve is grossly normal. Trivial mitral valve regurgitation.  5. The aortic valve is tricuspid. Aortic valve regurgitation is not visualized. Mild to moderate aortic valve sclerosis/calcification is present, without any evidence of aortic stenosis.  6. Aortic dilatation noted. There is mild dilatation of the ascending aorta.  7. The inferior vena cava is normal in size with greater  than 50% respiratory variability, suggesting right atrial pressure of 3 mmHg. FINDINGS  Left Ventricle: Left ventricular ejection fraction, by estimation, is 40%. The left ventricle has mild to moderately decreased function. The left ventricle demonstrates regional wall motion abnormalities. The left ventricular internal cavity size was mildly dilated. There is severe left ventricular hypertrophy. Left ventricular diastolic parameters are consistent with Grade I diastolic dysfunction (impaired relaxation).  LV Wall Scoring: The inferior wall is akinetic. Right Ventricle: The right ventricular size is normal. No increase in right ventricular wall thickness.  Right ventricular systolic function is normal. Tricuspid regurgitation signal is inadequate for assessing PA pressure. Left Atrium: Left atrial size was mildly dilated. Right Atrium: Right atrial size was normal in size. Pericardium: There is no evidence of pericardial effusion. Mitral Valve: The mitral valve is grossly normal. Mild mitral annular calcification. Trivial mitral valve regurgitation. Tricuspid Valve: The tricuspid valve is grossly normal. Tricuspid valve regurgitation is trivial. Aortic Valve: The aortic valve is tricuspid. Aortic valve regurgitation is not visualized. Mild to moderate aortic valve sclerosis/calcification is present, without any evidence of aortic stenosis. Moderate aortic valve annular calcification. Pulmonic Valve: The pulmonic valve was grossly normal. Pulmonic valve regurgitation is not visualized. Aorta: Aortic dilatation noted. There is mild dilatation of the ascending aorta. Venous: The inferior vena cava is normal in size with greater than 50% respiratory variability, suggesting right atrial pressure of 3 mmHg. IAS/Shunts: No atrial level shunt detected by color flow Doppler.  LEFT VENTRICLE PLAX 2D LVIDd:         5.68 cm  Diastology LVIDs:         4.80 cm  LV e' lateral:   9.73 cm/s LV PW:         1.65 cm  LV E/e' lateral: 5.5 LV IVS:        1.74 cm  LV e' medial:    4.12 cm/s LVOT diam:     2.10 cm  LV E/e' medial:  12.9 LVOT Area:     3.46 cm  RIGHT VENTRICLE RV S prime:     8.59 cm/s TAPSE (M-mode): 1.4 cm LEFT ATRIUM           Index LA diam:      4.70 cm 2.04 cm/m LA Vol (A2C): 58.2 ml 25.28 ml/m LA Vol (A4C): 76.7 ml 33.32 ml/m   AORTA Ao Root diam: 3.60 cm MITRAL VALVE MV Area (PHT): 2.57 cm    SHUNTS MV Decel Time: 295 msec    Systemic Diam: 2.10 cm MV E velocity: 53.10 cm/s MV A velocity: 87.80 cm/s MV E/A ratio:  0.60 Nona Dell MD Electronically signed by Nona Dell MD Signature Date/Time: 08/28/2019/10:11:07 AM    Final      Discharge  Exam: Vitals:   09/02/19 2200 09/03/19 0625  BP: 128/62 (!) 122/59  Pulse: 74 73  Resp: 20 17  Temp: 98.8 F (37.1 C) 98 F (36.7 C)  SpO2: 95% 97%   Vitals:   09/02/19 1530 09/02/19 2035 09/02/19 2200 09/03/19 0625  BP:   128/62 (!) 122/59  Pulse:   74 73  Resp:   20 17  Temp:   98.8 F (37.1 C) 98 F (36.7 C)  TempSrc:   Oral Oral  SpO2: 94% 93% 95% 97%  Weight:      Height:        General: Pt is alert, awake, not in acute distress Cardiovascular: RRR, S1/S2 +, no rubs, no gallops Respiratory: CTA bilaterally, no wheezing,  no rhonchi Abdominal: Soft, NT, ND, bowel sounds + Extremities: no edema, no cyanosis    The results of significant diagnostics from this hospitalization (including imaging, microbiology, ancillary and laboratory) are listed below for reference.     Microbiology: Recent Results (from the past 240 hour(s))  Respiratory Panel by RT PCR (Flu A&B, Covid) - Nasopharyngeal Swab     Status: None   Collection Time: 08/27/19  6:27 PM   Specimen: Nasopharyngeal Swab  Result Value Ref Range Status   SARS Coronavirus 2 by RT PCR NEGATIVE NEGATIVE Final    Comment: (NOTE) SARS-CoV-2 target nucleic acids are NOT DETECTED. The SARS-CoV-2 RNA is generally detectable in upper respiratoy specimens during the acute phase of infection. The lowest concentration of SARS-CoV-2 viral copies this assay can detect is 131 copies/mL. A negative result does not preclude SARS-Cov-2 infection and should not be used as the sole basis for treatment or other patient management decisions. A negative result may occur with  improper specimen collection/handling, submission of specimen other than nasopharyngeal swab, presence of viral mutation(s) within the areas targeted by this assay, and inadequate number of viral copies (<131 copies/mL). A negative result must be combined with clinical observations, patient history, and epidemiological information. The expected result is  Negative. Fact Sheet for Patients:  https://www.moore.com/ Fact Sheet for Healthcare Providers:  https://www.young.biz/ This test is not yet ap proved or cleared by the [place] FDA and  has been authorized for detection and/or diagnosis of SARS-CoV-2 by FDA under an Emergency Use Authorization (EUA). This EUA will remain  in effect (meaning this test can be used) for the duration of the COVID-19 declaration under Section 564(b)(1) of the Act, 21 U.S.C. section 360bbb-3(b)(1), unless the authorization is terminated or revoked sooner.    Influenza A by PCR NEGATIVE NEGATIVE Final   Influenza B by PCR NEGATIVE NEGATIVE Final    Comment: (NOTE) The Xpert Xpress SARS-CoV-2/FLU/RSV assay is intended as an aid in  the diagnosis of influenza from Nasopharyngeal swab specimens and  should not be used as a sole basis for treatment. Nasal washings and  aspirates are unacceptable for Xpert Xpress SARS-CoV-2/FLU/RSV  testing. Fact Sheet for Patients: https://www.moore.com/ Fact Sheet for Healthcare Providers: https://www.young.biz/ This test is not yet approved or cleared by the [place] FDA and  has been authorized for detection and/or diagnosis of SARS-CoV-2 by  FDA under an Emergency Use Authorization (EUA). This EUA will remain  in effect (meaning this test can be used) for the duration of the  Covid-19 declaration under Section 564(b)(1) of the Act, 21  U.S.C. section 360bbb-3(b)(1), unless the authorization is  terminated or revoked. Performed at [facility], [address] [place], [place] [address]      Labs: BNP (last 3 results) No results for input(s): BNP in the last 8760 hours. Basic Metabolic Panel: Recent Labs  Lab 08/27/19 1658 08/30/19 1258 08/31/19 1027  NA 136 139 138  K 4.5 3.3* 3.7  CL 103 101 98  CO2 GLUCOSE 397* 119* 208*  BUN 25* 19 22  CREATININE 1.18 1.02  1.12  CALCIUM 9.0 9.1 9.0  MG  --  2.0  --    Liver Function Tests: Recent Labs  Lab 08/27/19 1658  AST 12*  ALT 12  ALKPHOS 54  BILITOT 0.8  PROT 6.4*  ALBUMIN 3.6   No results for input(s): LIPASE, AMYLASE in the last 168 hours. No results for input(s): AMMONIA in the last 168  hours. CBC: Recent Labs  Lab 08/27/19 1658  WBC 5.7  NEUTROABS 4.3  HGB 13.4  HCT 41.1  MCV 92.2  PLT 254   Cardiac Enzymes: No results for input(s): CKTOTAL, CKMB, CKMBINDEX, TROPONINI in the last 168 hours. BNP: Invalid input(s): POCBNP CBG: Recent Labs  Lab 09/02/19 1413 09/02/19 1650 09/02/19 2027 09/03/19 0308 09/03/19 0733  GLUCAP 117* 128* 89 91 81   D-Dimer No results for input(s): DDIMER in the last 72 hours. Hgb A1c No results for input(s): HGBA1C in the last 72 hours. Lipid Profile No results for input(s): CHOL, HDL, LDLCALC, TRIG, CHOLHDL, LDLDIRECT in the last 72 hours. Thyroid function studies No results for input(s): TSH, T4TOTAL, T3FREE, THYROIDAB in the last 72 hours.  Invalid input(s): FREET3 Anemia work up No results for input(s): VITAMINB12, FOLATE, FERRITIN, TIBC, IRON, RETICCTPCT in the last 72 hours. Urinalysis    Component Value Date/Time   COLORURINE YELLOW 08/27/2019 2200   APPEARANCEUR CLEAR 08/27/2019 2200   LABSPEC 1.022 08/27/2019 2200   PHURINE 5.0 08/27/2019 2200   GLUCOSEU >=500 (A) 08/27/2019 2200   HGBUR NEGATIVE 08/27/2019 2200   BILIRUBINUR NEGATIVE 08/27/2019 2200   BILIRUBINUR negative 12/23/2014 1613   KETONESUR 5 (A) 08/27/2019 2200   PROTEINUR NEGATIVE 08/27/2019 2200   UROBILINOGEN negative 12/23/2014 1613   NITRITE NEGATIVE 08/27/2019 2200   LEUKOCYTESUR NEGATIVE 08/27/2019 2200   Sepsis Labs Invalid input(s): PROCALCITONIN,  WBC,  LACTICIDVEN Microbiology Recent Results (from the past 240 hour(s))  Respiratory Panel by RT PCR (Flu A&B, Covid) - Nasopharyngeal Swab     Status: None   Collection Time: 08/27/19  6:27 PM    Specimen: Nasopharyngeal Swab  Result Value Ref Range Status   SARS Coronavirus 2 by RT PCR NEGATIVE NEGATIVE Final    Comment: (NOTE) SARS-CoV-2 target nucleic acids are NOT DETECTED. The SARS-CoV-2 RNA is generally detectable in upper respiratoy specimens during the acute phase of infection. The lowest concentration of SARS-CoV-2 viral copies this assay can detect is 131 copies/mL. A negative result does not preclude SARS-Cov-2 infection and should not be used as the sole basis for treatment or other patient management decisions. A negative result may occur with  improper specimen collection/handling, submission of specimen other than nasopharyngeal swab, presence of viral mutation(s) within the areas targeted by this assay, and inadequate number of viral copies (<131 copies/mL). A negative result must be combined with clinical observations, patient history, and epidemiological information. The expected result is Negative. Fact Sheet for Patients:  https://www.moore.com/ Fact Sheet for Healthcare Providers:  https://www.young.biz/ This test is not yet ap proved or cleared by the [place] FDA and  has been authorized for detection and/or diagnosis of SARS-CoV-2 by FDA under an Emergency Use Authorization (EUA). This EUA will remain  in effect (meaning this test can be used) for the duration of the COVID-19 declaration under Section 564(b)(1) of the Act, 21 U.S.C. section 360bbb-3(b)(1), unless the authorization is terminated or revoked sooner.    Influenza A by PCR NEGATIVE NEGATIVE Final   Influenza B by PCR NEGATIVE NEGATIVE Final    Comment: (NOTE) The Xpert Xpress SARS-CoV-2/FLU/RSV assay is intended as an aid in  the diagnosis of influenza from Nasopharyngeal swab specimens and  should not be used as a sole basis for treatment. Nasal washings and  aspirates are unacceptable for Xpert Xpress SARS-CoV-2/FLU/RSV  testing. Fact Sheet  for Patients: https://www.moore.com/ Fact Sheet for Healthcare Providers: https://www.young.biz/ This test is not yet approved or cleared by the  [place] [employer] FDA and  has been authorized for detection and/or diagnosis of SARS-CoV-2 by  FDA under an TEFL teachermergency Use Authorization (EUA). This EUA will remain  in effect (meaning this test can be used) for the duration of the  Covid-19 declaration under Section 564(b)(1) of the Act, 21  U.S.C. section 360bbb-3(b)(1), unless the authorization is  terminated or revoked. Performed at [facility], [address] [place], [place] [address]      Time coordinating discharge: 35 minutes  SIGNED:   Erick BlinksPratik D Lenoir Facchini, DO [facility] 09/03/2019, 11:35 AM  If 7PM-7AM, please contact night-coverage [email or web address]

## 2019-09-03 NOTE — Care Management Important Message (Signed)
Important Message  Patient Details  Name: William Sharp MRN: [id number] Date of Birth: 01-18-1941   Medicare Important Message Given:  Yes     Corey Harold 09/03/2019, 4:19 PM

## 2019-09-03 NOTE — Progress Notes (Addendum)
Has been alert to self and place today. Fed self breakfast.  After morning dose of seroquel, was more drowsy for therapy.  Later , unable to stay awake for lunch even after uncovering and sliding up in bed.  Contacted Dr. Sherryll Burger and he is reducing frequency of seroquel from 25mg  BID to just HS

## 2019-09-03 NOTE — TOC Transition Note (Signed)
Transition of Care [facility]) - CM/SW Discharge Note   Patient Details  Name: William Sharp MRN: [id number] Date of Birth: 07-05-1940  Transition of Care [facility]) CM/SW Contact:  Elliot Gault, LCSW Phone Number: 09/03/2019, 2:27 PM   Clinical Narrative:     Pt stable for dc today per MD. Pt's behavior stable. [name] from [facility] here to see pt and states that they can take him today. Spoke with pt's wife by phone to update. She remains in agreement with plan. Pt will need EMS.  DC clinical sent electronically. RN to call report [phone].   There are no other TOC needs for dc.  Final next level of care: Skilled Nursing Facility Barriers to Discharge: Barriers Resolved   Patient Goals and CMS Choice Patient states their goals for this hospitalization and ongoing recovery are:: to return home. CMS Medicare.gov Compare Post Acute Care list provided to:: Patient Choice offered to / list presented to : Patient  Discharge Placement PASRR number recieved: 08/29/19            Patient chooses bed at: [facility] Patient to be transferred to facility by: ems Name of family member notified: [name] (wife) Patient and family notified of of transfer: 09/03/19  Discharge Plan and Services                          HH Arranged: PT [facility] Agency: [facility] Date HH Agency Contacted: 08/28/19 Time HH Agency Contacted: 1607 Representative spoke with at [facility] Agency: Tim Justice  Social Determinants of Health (SDOH) Interventions     Readmission Risk Interventions No flowsheet data found.

## 2019-09-03 NOTE — Progress Notes (Signed)
Report called to [facility] at [facility] in [place]

## 2019-10-30 ENCOUNTER — Emergency Department (HOSPITAL_COMMUNITY)
Admission: EM | Admit: 2019-10-30 | Discharge: 2019-11-13 | Disposition: E | Payer: Medicare Other | Attending: Emergency Medicine | Admitting: Emergency Medicine

## 2019-10-30 DIAGNOSIS — E119 Type 2 diabetes mellitus without complications: Secondary | ICD-10-CM | POA: Diagnosis not present

## 2019-10-30 DIAGNOSIS — I2510 Atherosclerotic heart disease of native coronary artery without angina pectoris: Secondary | ICD-10-CM | POA: Diagnosis not present

## 2019-10-30 DIAGNOSIS — I469 Cardiac arrest, cause unspecified: Secondary | ICD-10-CM | POA: Diagnosis not present

## 2019-10-30 DIAGNOSIS — I472 Ventricular tachycardia, unspecified: Secondary | ICD-10-CM

## 2019-10-30 LAB — I-STAT CHEM 8, ED
BUN: 38 mg/dL — ABNORMAL HIGH (ref 8–23)
Calcium, Ion: 0.95 mmol/L — ABNORMAL LOW (ref 1.15–1.40)
Chloride: 111 mmol/L (ref 98–111)
Creatinine, Ser: 1.4 mg/dL — ABNORMAL HIGH (ref 0.61–1.24)
Glucose, Bld: 162 mg/dL — ABNORMAL HIGH (ref 70–99)
HCT: 33 % — ABNORMAL LOW (ref 39.0–52.0)
Hemoglobin: 11.2 g/dL — ABNORMAL LOW (ref 13.0–17.0)
Potassium: 5 mmol/L (ref 3.5–5.1)
Sodium: 145 mmol/L (ref 135–145)
TCO2: 19 mmol/L — ABNORMAL LOW (ref 22–32)

## 2019-10-30 MED ORDER — MAGNESIUM SULFATE 50 % IJ SOLN
INTRAMUSCULAR | Status: AC | PRN
  Administered 2019-10-30: 2 g via INTRAVENOUS

## 2019-10-30 MED ORDER — EPINEPHRINE 1 MG/10ML IJ SOSY
PREFILLED_SYRINGE | INTRAMUSCULAR | Status: AC | PRN
  Administered 2019-10-30 (×4): 1 mg via INTRAVENOUS

## 2019-10-30 MED ORDER — CALCIUM CHLORIDE 10 % IV SOLN
INTRAVENOUS | Status: AC | PRN
  Administered 2019-10-30: 1 g via INTRAVENOUS

## 2019-10-30 MED ORDER — ATROPINE SULFATE 1 MG/ML IJ SOLN
INTRAMUSCULAR | Status: AC | PRN
  Administered 2019-10-30: 1 mg via INTRAVENOUS

## 2019-10-30 MED ORDER — DEXTROSE 5 % IV SOLN
INTRAVENOUS | Status: AC | PRN
  Administered 2019-10-30: 300 mg via INTRAVENOUS

## 2019-10-31 MED FILL — Medication: Qty: 1 | Status: AC

## 2019-11-13 NOTE — Consult Note (Signed)
Responded to page to talk with Dr. Criss Alvine re: speaking with deceased pt's wife and daughter by phone. Did so, called family (who were driving), expressed condolences, provided spiritual care, prayer,consultation re: possible arrangements, and pt placement number. Wife [place] Sink was especially appreciative of prayer, and daughter (a Engineer, site) in general of chaplain services.   Rev. Donnel Saxon Chaplain

## 2019-11-13 NOTE — Code Documentation (Signed)
Pulse check; resumed compressions.

## 2019-11-13 NOTE — Code Documentation (Signed)
Resumed compressions

## 2019-11-13 NOTE — ED Triage Notes (Signed)
EMS reported patient was last seen 1635 and CPR initiated by staff around 1630; EMS stated approx 1735 they took over CPR and revived after 15 mins. Patient was then paced en route to ED; approx 1826 CPR was again initiated. CPR in progress on arrival to ED.

## 2019-11-13 NOTE — Code Documentation (Signed)
Pulse / rhythm check, asystole.

## 2019-11-13 NOTE — Code Documentation (Signed)
Rhythm check; 200 J; shocked delivered.

## 2019-11-13 NOTE — ED Provider Notes (Signed)
Procedure Name: Intubation Date/Time: 08-Nov-2019 6:58 PM Performed by: Maxwell Caul, PA-C Pre-anesthesia Checklist: Patient identified and Suction available Oxygen Delivery Method: Ambu bag Ventilation: Oral airway inserted - appropriate to patient size Laryngoscope Size: Glidescope and 3 Tube size: 7.5 mm Number of attempts: 1 Airway Equipment and Method: Video-laryngoscopy Placement Confirmation: ETT inserted through vocal cords under direct vision,  Positive ETCO2 and Breath sounds checked- equal and bilateral Secured at: 23 cm Tube secured with: ETT holder Dental Injury: Teeth and Oropharynx as per pre-operative assessment  Future Recommendations: Recommend- induction with short-acting agent, and alternative techniques readily available      Portions of this note were generated with Scientist, clinical (histocompatibility and immunogenetics). Dictation errors may occur despite best attempts at proofreading.    Maxwell Caul, PA-C 08-Nov-2019 1909    Pricilla Loveless, MD 10/31/19 [phone]

## 2019-11-13 NOTE — Code Documentation (Signed)
Rhythm check; no pulse. Resumed comnpressions

## 2019-11-13 NOTE — Code Documentation (Signed)
Resumed compressions; no pulse.

## 2019-11-13 NOTE — ED Provider Notes (Signed)
[facility] EMERGENCY DEPARTMENT Provider Note   CSN: [id number] Arrival date & time: 2019/11/01  1839     History Chief Complaint  Patient presents with   UNRESPONSIVE    William Sharp is a 79 y.o. male.  HPI 79 year old male presents in cardiac arrest.  EMS was called to the nursing facility for unresponsiveness.  Found to be pulseless and apneic.  CPR started and continued for about 15 minutes before achieving return of spontaneous circulation.  He then was being paced as he started to become bradycardic.  Just prior to arrival he developed arrest again and CPR was restarted and has been ongoing for about 15 more minutes.  1 total epinephrine as well as a couple defibrillations.  Has a prior history of CAD and diabetes.  Further history unavailable.  No past medical history on file.  There are no problems to display for this patient.        No family history on file.  Social History   Tobacco Use   Smoking status: Not on file  Substance Use Topics   Alcohol use: Not on file   Drug use: Not on file    Home Medications Prior to Admission medications   Not on File    Allergies    Patient has no allergy information on record.  Review of Systems   Review of Systems  Unable to perform ROS: Patient unresponsive    Physical Exam Updated Vital Signs There were no vitals taken for this visit.  Physical Exam Vitals and nursing note reviewed.  Constitutional:      Appearance: He is well-developed.     Interventions: He is intubated.  HENT:     Head: Normocephalic and atraumatic.     Right Ear: External ear normal.     Left Ear: External ear normal.     Nose: Nose normal.  Eyes:     General:        Right eye: No discharge.        Left eye: No discharge.  Pulmonary:     Effort: He is intubated.     Comments: King airway in place Abdominal:     General: There is no distension.  Musculoskeletal:     Cervical back: Neck supple.    Skin:    General: Skin is dry.  Neurological:     Mental Status: He is unresponsive.  Psychiatric:        Mood and Affect: Mood is not anxious.     ED Results / Procedures / Treatments   Labs (all labs ordered are listed, but only abnormal results are displayed) Labs Reviewed  I-STAT CHEM 8, ED - Abnormal; Notable for the following components:      Result Value   BUN 38 (*)    Creatinine, Ser 1.40 (*)    Glucose, Bld 162 (*)    Calcium, Ion 0.95 (*)    TCO2 19 (*)    Hemoglobin 11.2 (*)    HCT 33.0 (*)    All other components within normal limits    EKG None  Radiology No results found.  Procedures CPR  Date/Time: 11-01-2019 7:02 PM Performed by: Pricilla Loveless, MD Authorized by: Pricilla Loveless, MD  CPR Procedure Details:    ACLS/BLS initiated by EMS: Yes     CPR/ACLS performed in the ED: Yes     Outcome: Pt declared dead    CPR performed via ACLS guidelines under my direct supervision.  See  RN documentation for details including defibrillator use, medications, doses and timing.   (including critical care time)  Medications Ordered in ED Medications  EPINEPHrine (ADRENALIN) 1 MG/10ML injection (1 mg Intravenous Given November 14, 2019 1847)  calcium chloride injection (1 g Intravenous Given 11-14-2019 1842)  magnesium sulfate (IV Push/IM) injection (2 g Intravenous Given 2019-11-14 1843)  amiodarone (CORDARONE) 300 mg in dextrose 5 % 100 mL bolus (300 mg Intravenous New Bag/Given November 14, 2019 1848)  atropine injection (1 mg Intravenous Given 11/14/19 1851)    ED Course  I have reviewed the triage vital signs and the nursing notes.  Pertinent labs & imaging results that were available during my care of the patient were reviewed by me and considered in my medical decision making (see chart for details).    MDM Rules/Calculators/A&P                          CPR was performed for around 20 minutes.  Despite epinephrine, calcium, bicarb, and defibrillation for ventricular  tachycardia, we were never able to get return of spontaneous circulation.  He was given atropine for PEA after the ventricular tachycardia.  However now he has no pulse and given the length of time of all of the CPR, my suspicion is high for a severe neurologic injury if we were able were able to return spontaneous circulation.  He was pronounced at [facility] [place]. I notified the wife/daughter over the phone. Final Clinical Impression(s) / ED Diagnoses Final diagnoses:  Cardiac [facility]  Ventricular tachycardia [facility])    Rx / DC Orders ED Discharge Orders    None       Sherwood Gambler, MD 2019/11/14 2048

## 2019-11-13 NOTE — Code Documentation (Signed)
Patient time of death occurred at 1855. 

## 2019-11-13 NOTE — Code Documentation (Addendum)
Pulse check: brady, faint. Rhythm strip showed 23 BPM.

## 2019-11-13 DEATH — deceased

## 2021-12-17 IMAGING — MR MR HEAD W/O CM
10 of 11 series · 34 of 48 positions shown · non-contrast
Comparison: None.

CLINICAL DATA: Abnormal speech

EXAM:
MRI HEAD WITHOUT CONTRAST
MRA HEAD WITHOUT CONTRAST
TECHNIQUE: Multiplanar, multiecho pulse sequences of the brain and surrounding
structures were obtained without intravenous contrast. Angiographic
images of the head were obtained using MRA technique without
contrast.

[Series 2: T1 · sagittal · 5.0mm · 0.45mm/px · 2 of 20 slices shown (1 of 2)]
[im 1/20]
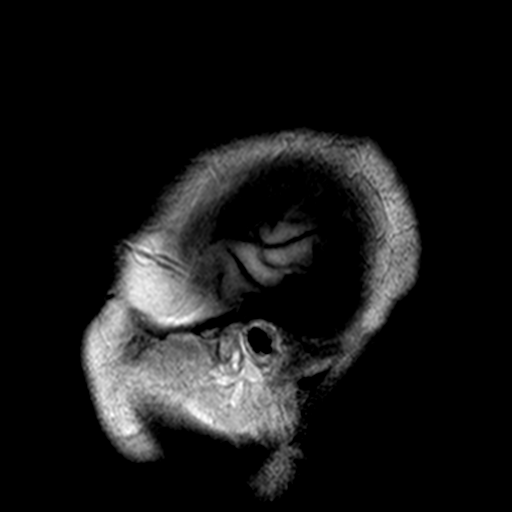
[im 20/20]
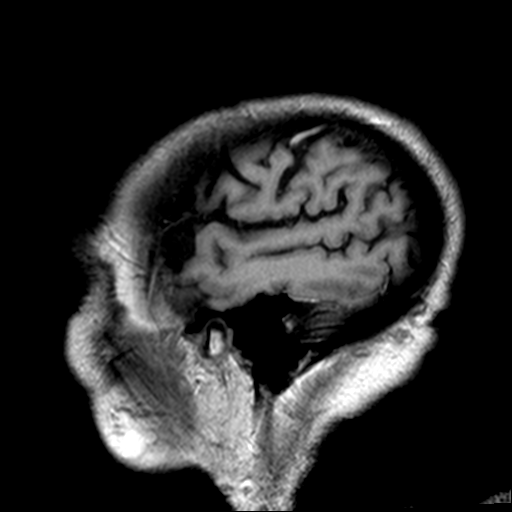

[Series 3: DWI · axial · 3.0mm · 0.82mm/px · z∈[-33,+128]mm · 5 of 55 slices shown (1 of 2)]
[im 1/55]
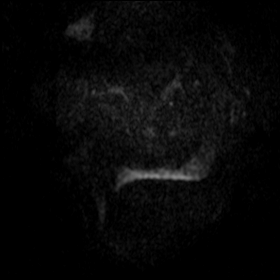
[im 14/55]
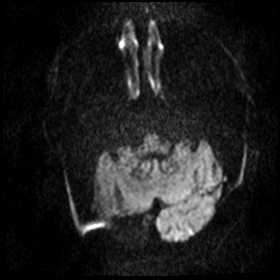
[im 28/55]
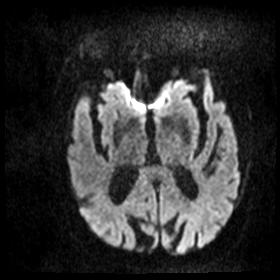
[im 41/55]
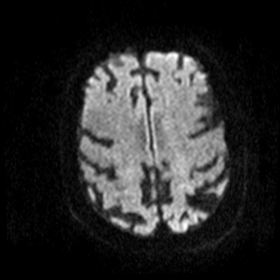
[im 55/55]
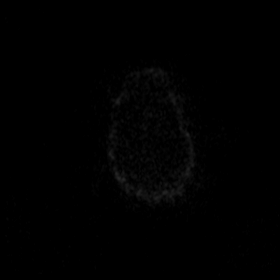

[Series 4: ax dwi_adc · axial · 3.0mm · 0.82mm/px · z∈[-33,+128]mm · 5 of 55 slices shown]
[im 1/55]
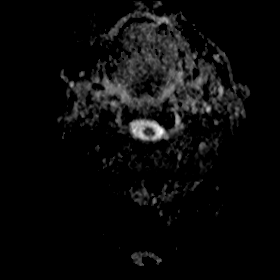
[im 14/55]
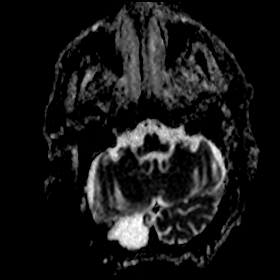
[im 28/55]
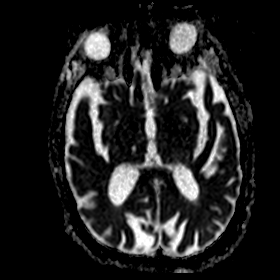
[im 41/55]
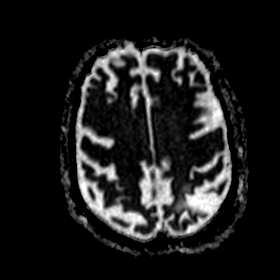
[im 55/55]
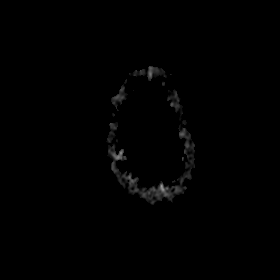

[Series 5: DWI · coronal · 5.0mm · 0.56mm/px · 3 of 34 slices shown (2 of 2)]
[im 1/34]
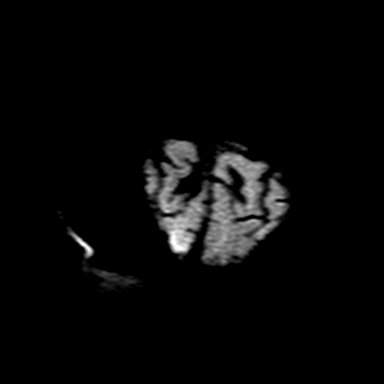
[im 17/34]
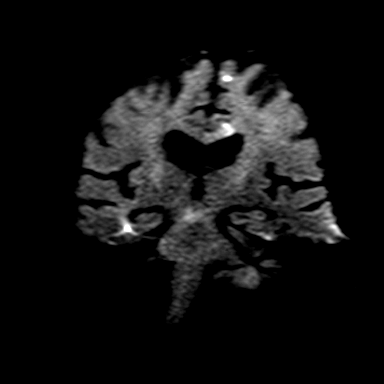
[im 34/34]
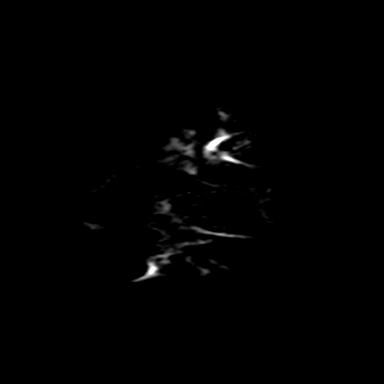

[Series 6: cor dwi_adc · coronal · 5.0mm · 0.56mm/px · 1 of 34 slices shown]
[im 1/34]
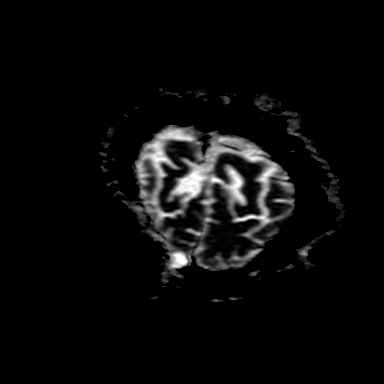

[Series 7: T2 · axial · 5.0mm · 0.75mm/px · z∈[-23,+119]mm · 2 of 23 slices shown (1 of 3)]
[im 1/23]
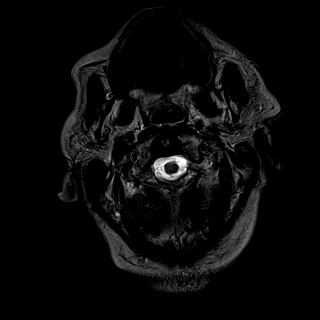
[im 23/23]
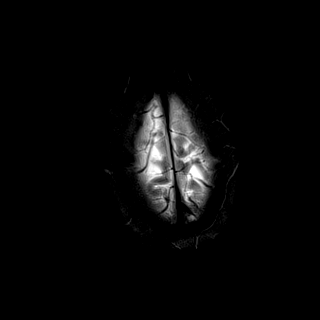

[Series 8: FLAIR · axial · 3.0mm · 0.94mm/px · z∈[-21,+116]mm · 4 of 47 slices shown]
[im 1/47]
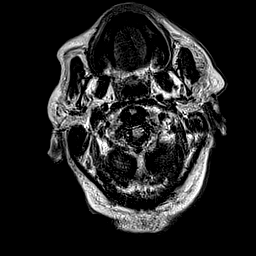
[im 16/47]
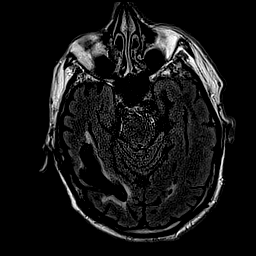
[im 31/47]
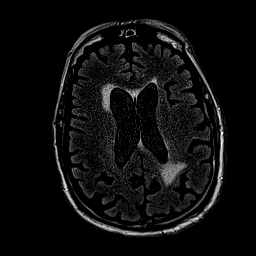
[im 47/47]
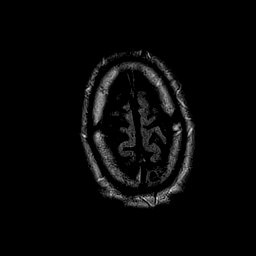

[Series 9: T2 · axial · 5.0mm · 0.45mm/px · z∈[-17,+112]mm · 2 of 21 slices shown (2 of 3)]
[im 1/21]
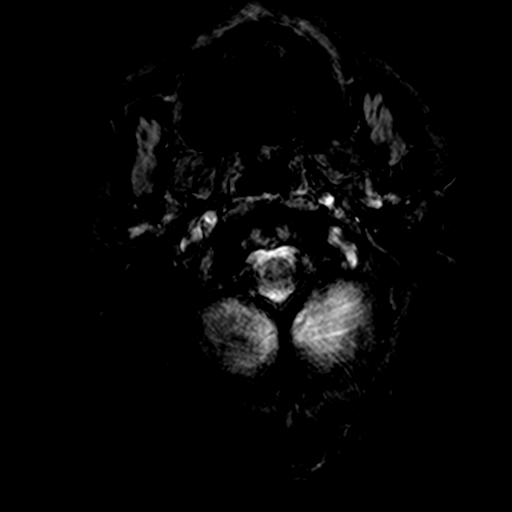
[im 21/21]
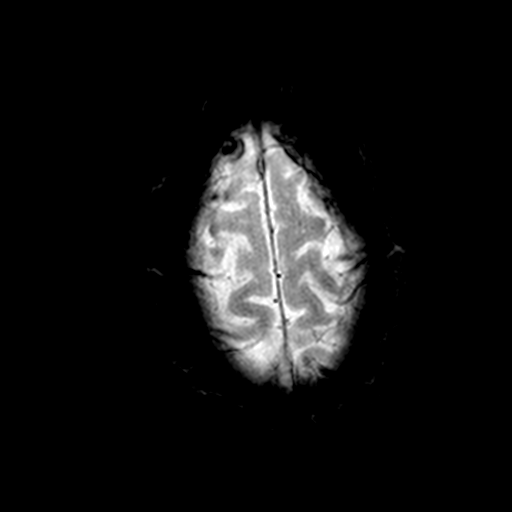

[Series 10: T1 · axial · 2.0mm · 0.47mm/px · z∈[-35,+151]mm · 8 of 95 slices shown (2 of 2)]
[im 1/95]
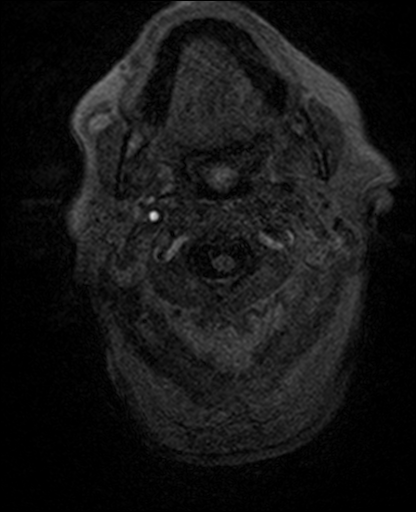
[im 14/95]
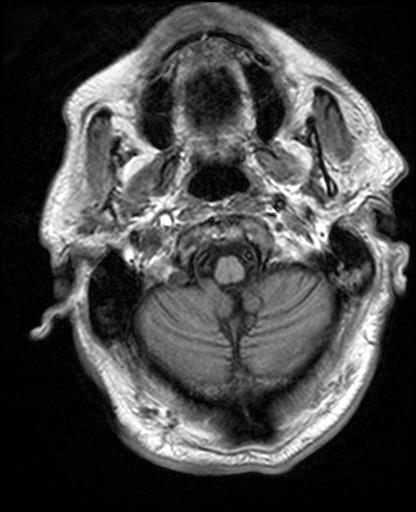
[im 27/95]
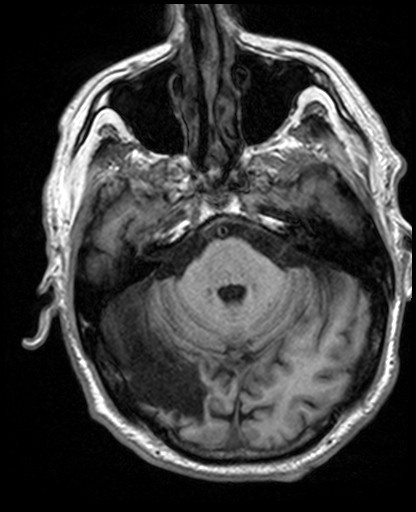
[im 41/95]
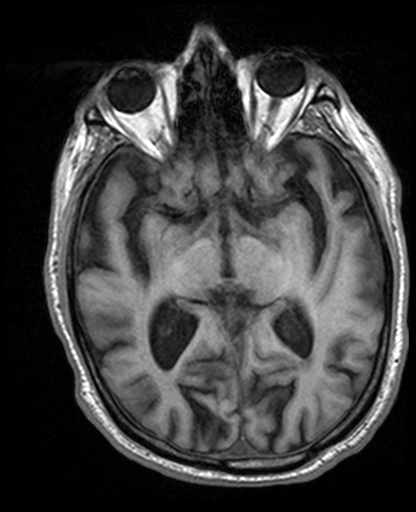
[im 54/95]
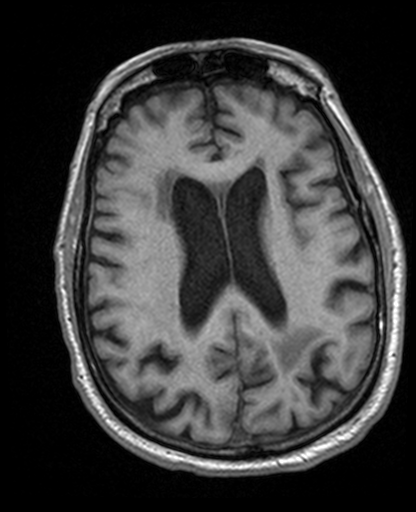
[im 68/95]
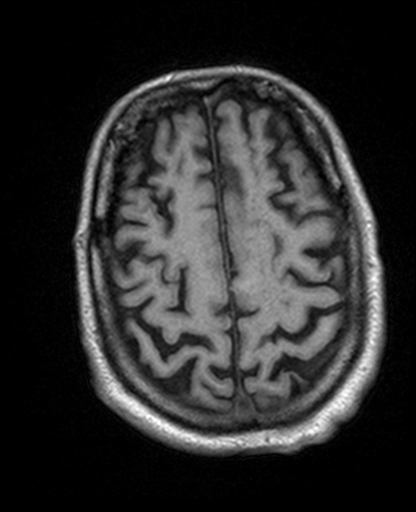
[im 81/95]
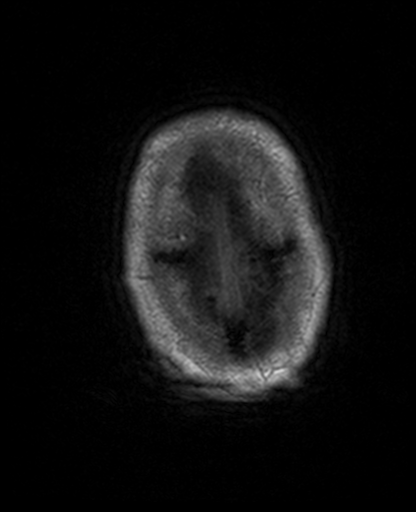
[im 95/95]
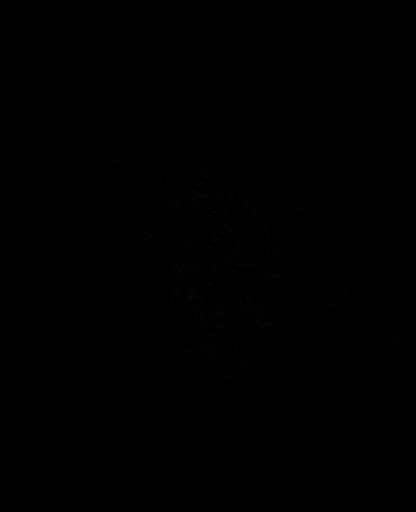

[Series 11: T2 · coronal · 5.0mm · 0.75mm/px · 2 of 28 slices shown (3 of 3)]
[im 1/28]
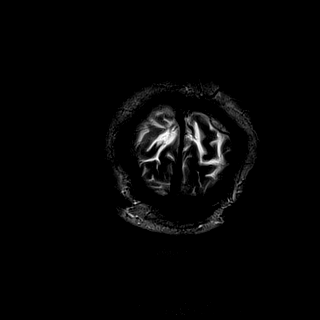
[im 28/28]
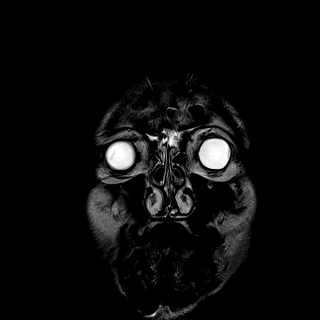

[34 of 48 positions shown; findings below may reference images not displayed]

FINDINGS: MRI HEAD

Brain: There are small foci of restricted diffusion in the
parasagittal left frontoparietal lobes.

Chronic infarcts are present the right occipitotemporal lobes, right
occipital lobe abutting the parieto-occipital sulcus, and left
superior parietal lobule. Chronic small vessel infarcts the basal
ganglia bilaterally and right thalamus. There are chronic blood
products associated with the right occipitotemporal infarct.

There is prominence of the ventricles and sulci reflecting
generalized parenchymal volume loss with superimposed ex vacuo
ventricular dilatation adjacent to infarcts. Additional patchy T2
hyperintensity in the supratentorial white matter is nonspecific but
probably reflects mild chronic microvascular ischemic changes. There
is no hydrocephalus or extra-axial fluid collection.

Vascular: Major vessel flow voids at the skull base are preserved.

Skull and upper cervical spine: Normal marrow signal is preserved.

Sinuses/Orbits: Minor mucosal thickening. No significant orbital
finding.

Other: Sella is unremarkable.  Mastoid air cells are clear.

MRA HEAD

Intracranial internal carotid arteries are patent with
atherosclerotic irregularity. Middle cerebral arteries are patent
with mild distal stenosis. Proximal anterior cerebral arteries are
patent. There is high-grade stenosis of the precallosal left ACA.
Additional high-grade proximal supracallosal right ACA stenosis.

Intracranial vertebral arteries, basilar artery, posterior cerebral
arteries are patent.

There is no aneurysm.
IMPRESSION: Few small acute infarcts in the left ACA territory. High-grade
stenosis of the precallosal left ACA.

Chronic infarcts and additional intracranial atherosclerosis
detailed above.
